# Patient Record
Sex: Female | Born: 1955 | Race: White | Hispanic: No | Marital: Single | Smoking: Former smoker
Health system: Southern US, Community
[De-identification: ages and names within clinical notes are randomized; demographics above are authoritative.]

## PROBLEM LIST (undated history)

## (undated) DIAGNOSIS — C50919 Malignant neoplasm of unspecified site of unspecified female breast: Secondary | ICD-10-CM

## (undated) DIAGNOSIS — Z8719 Personal history of other diseases of the digestive system: Secondary | ICD-10-CM

## (undated) DIAGNOSIS — K589 Irritable bowel syndrome without diarrhea: Secondary | ICD-10-CM

## (undated) DIAGNOSIS — K219 Gastro-esophageal reflux disease without esophagitis: Secondary | ICD-10-CM

## (undated) DIAGNOSIS — M1990 Unspecified osteoarthritis, unspecified site: Secondary | ICD-10-CM

## (undated) DIAGNOSIS — R232 Flushing: Secondary | ICD-10-CM

## (undated) HISTORY — PX: COLONOSCOPY W/ POLYPECTOMY: SHX1380

## (undated) HISTORY — PX: MASTECTOMY: SHX3

## (undated) HISTORY — PX: FINGER FRACTURE SURGERY: SHX638

---

## 2013-06-09 ENCOUNTER — Ambulatory Visit: Payer: Self-pay | Admitting: Physician Assistant

## 2013-07-10 ENCOUNTER — Ambulatory Visit: Payer: Self-pay

## 2013-08-07 ENCOUNTER — Ambulatory Visit: Payer: Self-pay | Admitting: Emergency Medicine

## 2018-01-06 ENCOUNTER — Encounter
Admission: EM | Disposition: A | Discharge status: Home Health | Payer: Self-pay | Source: Home / Self Care | Attending: Specialist

## 2018-01-06 ENCOUNTER — Inpatient Hospital Stay
Admission: EM | Admit: 2018-01-06 | Discharge: 2018-01-08 | DRG: 494 | Disposition: A | Discharge status: Home Health | Payer: BLUE CROSS/BLUE SHIELD | Attending: Specialist | Admitting: Specialist

## 2018-01-06 ENCOUNTER — Emergency Department: Payer: BLUE CROSS/BLUE SHIELD

## 2018-01-06 ENCOUNTER — Other Ambulatory Visit: Payer: Self-pay

## 2018-01-06 ENCOUNTER — Inpatient Hospital Stay: Payer: BLUE CROSS/BLUE SHIELD | Admitting: Anesthesiology

## 2018-01-06 ENCOUNTER — Encounter: Payer: Self-pay | Admitting: Emergency Medicine

## 2018-01-06 DIAGNOSIS — W010XXA Fall on same level from slipping, tripping and stumbling without subsequent striking against object, initial encounter: Secondary | ICD-10-CM | POA: Diagnosis present

## 2018-01-06 DIAGNOSIS — M25571 Pain in right ankle and joints of right foot: Secondary | ICD-10-CM | POA: Diagnosis not present

## 2018-01-06 DIAGNOSIS — J45909 Unspecified asthma, uncomplicated: Secondary | ICD-10-CM | POA: Diagnosis present

## 2018-01-06 DIAGNOSIS — Z7951 Long term (current) use of inhaled steroids: Secondary | ICD-10-CM

## 2018-01-06 DIAGNOSIS — S82891A Other fracture of right lower leg, initial encounter for closed fracture: Secondary | ICD-10-CM | POA: Diagnosis present

## 2018-01-06 DIAGNOSIS — Z01810 Encounter for preprocedural cardiovascular examination: Secondary | ICD-10-CM | POA: Diagnosis not present

## 2018-01-06 DIAGNOSIS — Y92008 Other place in unspecified non-institutional (private) residence as the place of occurrence of the external cause: Secondary | ICD-10-CM | POA: Diagnosis not present

## 2018-01-06 DIAGNOSIS — Z23 Encounter for immunization: Secondary | ICD-10-CM | POA: Diagnosis not present

## 2018-01-06 DIAGNOSIS — S82851A Displaced trimalleolar fracture of right lower leg, initial encounter for closed fracture: Secondary | ICD-10-CM

## 2018-01-06 DIAGNOSIS — T148XXA Other injury of unspecified body region, initial encounter: Secondary | ICD-10-CM

## 2018-01-06 DIAGNOSIS — S9304XA Dislocation of right ankle joint, initial encounter: Secondary | ICD-10-CM

## 2018-01-06 HISTORY — DX: Malignant neoplasm of unspecified site of unspecified female breast: C50.919

## 2018-01-06 HISTORY — DX: Flushing: R23.2

## 2018-01-06 HISTORY — PX: ORIF ANKLE FRACTURE: SHX5408

## 2018-01-06 LAB — CBC WITH DIFFERENTIAL/PLATELET
BASOS ABS: 0 10*3/uL (ref 0–0.1)
Basophils Relative: 1 %
EOS ABS: 0.2 10*3/uL (ref 0–0.7)
Eosinophils Relative: 2 %
HCT: 38.7 % (ref 35.0–47.0)
Hemoglobin: 13.1 g/dL (ref 12.0–16.0)
Lymphocytes Relative: 25 %
Lymphs Abs: 2 10*3/uL (ref 1.0–3.6)
MCH: 29.9 pg (ref 26.0–34.0)
MCHC: 34 g/dL (ref 32.0–36.0)
MCV: 88 fL (ref 80.0–100.0)
Monocytes Absolute: 0.5 10*3/uL (ref 0.2–0.9)
Monocytes Relative: 6 %
Neutro Abs: 5.1 10*3/uL (ref 1.4–6.5)
Neutrophils Relative %: 66 %
Platelets: 224 10*3/uL (ref 150–440)
RBC: 4.39 MIL/uL (ref 3.80–5.20)
RDW: 14.2 % (ref 11.5–14.5)
WBC: 7.8 10*3/uL (ref 3.6–11.0)

## 2018-01-06 LAB — COMPREHENSIVE METABOLIC PANEL
ALK PHOS: 43 U/L (ref 38–126)
ALT: 17 U/L (ref 0–44)
ANION GAP: 9 (ref 5–15)
AST: 19 U/L (ref 15–41)
Albumin: 3.9 g/dL (ref 3.5–5.0)
BUN: 9 mg/dL (ref 8–23)
CALCIUM: 8.8 mg/dL — AB (ref 8.9–10.3)
CO2: 23 mmol/L (ref 22–32)
CREATININE: 0.54 mg/dL (ref 0.44–1.00)
Chloride: 109 mmol/L (ref 98–111)
GFR calc Af Amer: >60 mL/min (ref >60)
GFR calc non Af Amer: >60 mL/min (ref >60)
GLUCOSE: 92 mg/dL (ref 70–99)
Potassium: 4.2 mmol/L (ref 3.5–5.1)
Sodium: 141 mmol/L (ref 135–145)
Total Bilirubin: 0.3 mg/dL (ref 0.3–1.2)
Total Protein: 7 g/dL (ref 6.5–8.1)

## 2018-01-06 LAB — ETHANOL: Alcohol, Ethyl (B): 141 mg/dL — ABNORMAL HIGH (ref <10)

## 2018-01-06 LAB — PROTIME-INR
INR: 0.92
PROTHROMBIN TIME: 12.3 s (ref 11.4–15.2)

## 2018-01-06 LAB — SURGICAL PCR SCREEN
MRSA, PCR: NEGATIVE
Staphylococcus aureus: NEGATIVE

## 2018-01-06 SURGERY — OPEN REDUCTION INTERNAL FIXATION (ORIF) ANKLE FRACTURE
Anesthesia: Spinal | Laterality: Right

## 2018-01-06 MED ORDER — MIDAZOLAM HCL 2 MG/2ML IJ SOLN
INTRAMUSCULAR | Status: AC
  Filled 2018-01-06: qty 2

## 2018-01-06 MED ORDER — FENTANYL CITRATE (PF) 100 MCG/2ML IJ SOLN
INTRAMUSCULAR | Status: DC | PRN
  Administered 2018-01-06: 100 ug via INTRAVENOUS

## 2018-01-06 MED ORDER — CEFAZOLIN SODIUM-DEXTROSE 1-4 GM/50ML-% IV SOLN
1.0000 g | INTRAVENOUS | Status: AC
  Administered 2018-01-06: 1 g via INTRAVENOUS
  Filled 2018-01-06: qty 50

## 2018-01-06 MED ORDER — BUPIVACAINE HCL (PF) 0.5 % IJ SOLN
INTRAMUSCULAR | Status: AC
  Filled 2018-01-06: qty 30

## 2018-01-06 MED ORDER — PROPOFOL 10 MG/ML IV BOLUS
INTRAVENOUS | Status: DC | PRN
  Administered 2018-01-06: 30 mg via INTRAVENOUS
  Administered 2018-01-06: 40 mg via INTRAVENOUS
  Administered 2018-01-06: 30 mg via INTRAVENOUS

## 2018-01-06 MED ORDER — CEFAZOLIN SODIUM-DEXTROSE 2-4 GM/100ML-% IV SOLN
2.0000 g | Freq: Three times a day (TID) | INTRAVENOUS | Status: AC
  Administered 2018-01-06 – 2018-01-07 (×3): 2 g via INTRAVENOUS
  Filled 2018-01-06 (×4): qty 100

## 2018-01-06 MED ORDER — ONDANSETRON HCL 4 MG/2ML IJ SOLN
INTRAMUSCULAR | Status: AC
  Filled 2018-01-06: qty 2

## 2018-01-06 MED ORDER — SODIUM CHLORIDE 0.9 % IV SOLN
INTRAVENOUS | Status: DC | PRN
  Administered 2018-01-06: 50 ug/min via INTRAVENOUS

## 2018-01-06 MED ORDER — ONDANSETRON HCL 4 MG/2ML IJ SOLN
4.0000 mg | Freq: Once | INTRAMUSCULAR | Status: AC
Start: 2018-01-06 — End: 2018-01-06
  Administered 2018-01-06: 4 mg via INTRAVENOUS
  Filled 2018-01-06: qty 2

## 2018-01-06 MED ORDER — PROPOFOL 10 MG/ML IV BOLUS
INTRAVENOUS | Status: AC
  Filled 2018-01-06: qty 20

## 2018-01-06 MED ORDER — EPHEDRINE SULFATE 50 MG/ML IJ SOLN
INTRAMUSCULAR | Status: DC | PRN
  Administered 2018-01-06: 50 mg via INTRAMUSCULAR

## 2018-01-06 MED ORDER — DEXMEDETOMIDINE HCL 200 MCG/2ML IV SOLN
INTRAVENOUS | Status: DC | PRN
  Administered 2018-01-06 (×2): 8 ug via INTRAVENOUS
  Administered 2018-01-06: 12 ug via INTRAVENOUS

## 2018-01-06 MED ORDER — SODIUM CHLORIDE 0.9 % IV SOLN
INTRAVENOUS | Status: DC
  Administered 2018-01-06 (×2): via INTRAVENOUS
  Administered 2018-01-06: 100 mL/h via INTRAVENOUS

## 2018-01-06 MED ORDER — TETANUS-DIPHTH-ACELL PERTUSSIS 5-2.5-18.5 LF-MCG/0.5 IM SUSP
0.5000 mL | Freq: Once | INTRAMUSCULAR | Status: AC
  Administered 2018-01-06: 0.5 mL via INTRAMUSCULAR
  Filled 2018-01-06: qty 0.5

## 2018-01-06 MED ORDER — PHENYLEPHRINE HCL 10 MG/ML IJ SOLN
INTRAMUSCULAR | Status: AC
  Filled 2018-01-06: qty 1

## 2018-01-06 MED ORDER — CLINDAMYCIN PHOSPHATE 600 MG/50ML IV SOLN
600.0000 mg | Freq: Three times a day (TID) | INTRAVENOUS | Status: AC
  Administered 2018-01-06 – 2018-01-07 (×3): 600 mg via INTRAVENOUS
  Filled 2018-01-06 (×3): qty 50

## 2018-01-06 MED ORDER — FENTANYL CITRATE (PF) 100 MCG/2ML IJ SOLN
INTRAMUSCULAR | Status: AC
  Filled 2018-01-06: qty 2

## 2018-01-06 MED ORDER — CLINDAMYCIN PHOSPHATE 600 MG/50ML IV SOLN
600.0000 mg | INTRAVENOUS | Status: DC
  Filled 2018-01-06: qty 50

## 2018-01-06 MED ORDER — FENTANYL CITRATE (PF) 100 MCG/2ML IJ SOLN
50.0000 ug | Freq: Once | INTRAMUSCULAR | Status: AC
  Administered 2018-01-06: 50 ug via INTRAVENOUS
  Filled 2018-01-06: qty 2

## 2018-01-06 MED ORDER — ETOMIDATE 2 MG/ML IV SOLN
INTRAVENOUS | Status: AC
  Filled 2018-01-06: qty 10

## 2018-01-06 MED ORDER — MELOXICAM 7.5 MG PO TABS
15.0000 mg | ORAL_TABLET | Freq: Every day | ORAL | Status: DC
  Administered 2018-01-06 – 2018-01-08 (×3): 15 mg via ORAL
  Filled 2018-01-06 (×4): qty 2

## 2018-01-06 MED ORDER — MIDAZOLAM HCL 2 MG/2ML IJ SOLN
INTRAMUSCULAR | Status: DC | PRN
  Administered 2018-01-06: 2 mg via INTRAVENOUS

## 2018-01-06 MED ORDER — BUPIVACAINE HCL 0.5 % IJ SOLN
INTRAMUSCULAR | Status: DC | PRN
Start: 2018-01-06 — End: 2018-01-06
  Administered 2018-01-06: 30 mL

## 2018-01-06 MED ORDER — BUPIVACAINE HCL (PF) 0.5 % IJ SOLN
INTRAMUSCULAR | Status: AC
  Filled 2018-01-06: qty 10

## 2018-01-06 MED ORDER — LORATADINE 10 MG PO TABS
10.0000 mg | ORAL_TABLET | Freq: Every day | ORAL | Status: DC
  Administered 2018-01-06 – 2018-01-08 (×3): 10 mg via ORAL
  Filled 2018-01-06 (×3): qty 1

## 2018-01-06 MED ORDER — SODIUM CHLORIDE 0.9 % IV BOLUS
1000.0000 mL | Freq: Once | INTRAVENOUS | Status: AC
  Administered 2018-01-06: 1000 mL via INTRAVENOUS

## 2018-01-06 MED ORDER — FENTANYL CITRATE (PF) 100 MCG/2ML IJ SOLN: 25.0000 ug | INTRAMUSCULAR | Status: DC | PRN

## 2018-01-06 MED ORDER — PROPOFOL 500 MG/50ML IV EMUL
INTRAVENOUS | Status: AC
  Filled 2018-01-06: qty 50

## 2018-01-06 MED ORDER — ONDANSETRON HCL 4 MG/2ML IJ SOLN
INTRAMUSCULAR | Status: DC | PRN
  Administered 2018-01-06: 4 mg via INTRAVENOUS

## 2018-01-06 MED ORDER — MONTELUKAST SODIUM 10 MG PO TABS
10.0000 mg | ORAL_TABLET | Freq: Every day | ORAL | Status: DC
  Administered 2018-01-06 – 2018-01-07 (×2): 10 mg via ORAL
  Filled 2018-01-06 (×2): qty 1

## 2018-01-06 MED ORDER — ONDANSETRON HCL 4 MG/2ML IJ SOLN: 4.0000 mg | Freq: Once | INTRAMUSCULAR | Status: DC | PRN

## 2018-01-06 MED ORDER — FLUTICASONE PROPIONATE 50 MCG/ACT NA SUSP
1.0000 | Freq: Two times a day (BID) | NASAL | Status: DC
  Administered 2018-01-06 – 2018-01-08 (×5): 1 via NASAL
  Filled 2018-01-06: qty 16

## 2018-01-06 MED ORDER — ETOMIDATE 2 MG/ML IV SOLN
10.0000 mg | Freq: Once | INTRAVENOUS | Status: AC
  Administered 2018-01-06: 6 mg via INTRAVENOUS

## 2018-01-06 MED ORDER — CLINDAMYCIN PHOSPHATE 600 MG/50ML IV SOLN
600.0000 mg | INTRAVENOUS | Status: AC
  Administered 2018-01-06: 600 mg via INTRAVENOUS
  Filled 2018-01-06: qty 50

## 2018-01-06 MED ORDER — GABAPENTIN 300 MG PO CAPS
300.0000 mg | ORAL_CAPSULE | Freq: Three times a day (TID) | ORAL | Status: DC
  Administered 2018-01-06 – 2018-01-08 (×5): 300 mg via ORAL
  Filled 2018-01-06 (×5): qty 1

## 2018-01-06 MED ORDER — PHENYLEPHRINE HCL 10 MG/ML IJ SOLN
INTRAMUSCULAR | Status: DC | PRN
  Administered 2018-01-06 (×2): 200 ug via INTRAVENOUS

## 2018-01-06 MED ORDER — FENTANYL CITRATE (PF) 100 MCG/2ML IJ SOLN
50.0000 ug | Freq: Once | INTRAMUSCULAR | Status: AC
  Administered 2018-01-06: 50 ug via INTRAVENOUS

## 2018-01-06 MED ORDER — EPHEDRINE SULFATE 50 MG/ML IJ SOLN
INTRAMUSCULAR | Status: AC
  Filled 2018-01-06: qty 1

## 2018-01-06 MED ORDER — NEOMYCIN-POLYMYXIN B GU 40-200000 IR SOLN
Status: AC
  Filled 2018-01-06: qty 4

## 2018-01-06 MED ORDER — CEFAZOLIN SODIUM-DEXTROSE 1-4 GM/50ML-% IV SOLN
1.0000 g | Freq: Once | INTRAVENOUS | Status: AC
  Administered 2018-01-06: 1 g via INTRAVENOUS
  Filled 2018-01-06: qty 50

## 2018-01-06 MED ORDER — NEOMYCIN-POLYMYXIN B GU 40-200000 IR SOLN
Status: DC | PRN
  Administered 2018-01-06: 4 mL

## 2018-01-06 MED ORDER — MORPHINE SULFATE (PF) 2 MG/ML IV SOLN: 1.0000 mg | INTRAVENOUS | Status: DC | PRN

## 2018-01-06 MED ORDER — DEXMEDETOMIDINE HCL IN NACL 200 MCG/50ML IV SOLN
INTRAVENOUS | Status: AC
  Filled 2018-01-06: qty 50

## 2018-01-06 MED ORDER — MUPIROCIN 2 % EX OINT
1.0000 "application " | TOPICAL_OINTMENT | Freq: Two times a day (BID) | CUTANEOUS | Status: DC
  Filled 2018-01-06: qty 22

## 2018-01-06 MED ORDER — GABAPENTIN 300 MG PO CAPS
300.0000 mg | ORAL_CAPSULE | Freq: Two times a day (BID) | ORAL | Status: DC
  Administered 2018-01-06: 300 mg via ORAL
  Filled 2018-01-06: qty 1

## 2018-01-06 MED ORDER — CEFAZOLIN SODIUM-DEXTROSE 2-4 GM/100ML-% IV SOLN
2.0000 g | INTRAVENOUS | Status: DC
  Filled 2018-01-06: qty 100

## 2018-01-06 MED ORDER — PROPOFOL 500 MG/50ML IV EMUL
INTRAVENOUS | Status: DC | PRN
  Administered 2018-01-06: 100 ug/kg/min via INTRAVENOUS
  Administered 2018-01-06: 11:00:00 via INTRAVENOUS

## 2018-01-06 MED ORDER — BUPIVACAINE HCL (PF) 0.5 % IJ SOLN
INTRAMUSCULAR | Status: DC | PRN
  Administered 2018-01-06: 3 mL via INTRATHECAL

## 2018-01-06 MED ORDER — HYDROCODONE-ACETAMINOPHEN 7.5-325 MG PO TABS
1.0000 | ORAL_TABLET | Freq: Four times a day (QID) | ORAL | Status: DC | PRN
  Administered 2018-01-06 – 2018-01-08 (×8): 1 via ORAL
  Filled 2018-01-06 (×8): qty 1

## 2018-01-06 SURGICAL SUPPLY — 56 items
BIT DRILL 2.5X110 QC LCP DISP (BIT) ×2 IMPLANT
BIT DRILL CANN 2.7X625 NONSTRL (BIT) ×2 IMPLANT
BLADE SURG SZ10 CARB STEEL (BLADE) ×2 IMPLANT
BNDG COHESIVE 4X5 TAN STRL (GAUZE/BANDAGES/DRESSINGS) ×2 IMPLANT
BNDG ESMARK 6X12 TAN STRL LF (GAUZE/BANDAGES/DRESSINGS) ×2 IMPLANT
CANISTER SUCT 1200ML W/VALVE (MISCELLANEOUS) ×2 IMPLANT
CAST PADDING 6X4YD ST 30248 (SOFTGOODS) ×1
CHLORAPREP W/TINT 26ML (MISCELLANEOUS) ×2 IMPLANT
CUFF TOURN 24 STER (MISCELLANEOUS) IMPLANT
CUFF TOURN 30 STER DUAL PORT (MISCELLANEOUS) IMPLANT
DRAPE FLUOR MINI C-ARM 54X84 (DRAPES) ×2 IMPLANT
DRSG TEGADERM 4X4.75 (GAUZE/BANDAGES/DRESSINGS) ×4 IMPLANT
ELECT REM PT RETURN 9FT ADLT (ELECTROSURGICAL) ×2
ELECTRODE REM PT RTRN 9FT ADLT (ELECTROSURGICAL) ×1 IMPLANT
GAUZE PETRO XEROFOAM 1X8 (MISCELLANEOUS) ×2 IMPLANT
GAUZE SPONGE 4X4 12PLY STRL (GAUZE/BANDAGES/DRESSINGS) ×2 IMPLANT
GAUZE XEROFORM 4X4 STRL (GAUZE/BANDAGES/DRESSINGS) ×2 IMPLANT
GLOVE SURG ORTHO 8.0 STRL STRW (GLOVE) ×6 IMPLANT
GOWN STRL REUS W/ TWL LRG LVL3 (GOWN DISPOSABLE) ×1 IMPLANT
GOWN STRL REUS W/TWL LRG LVL3 (GOWN DISPOSABLE) ×1
GOWN STRL REUS W/TWL LRG LVL4 (GOWN DISPOSABLE) ×2 IMPLANT
GUIDEWARE NON THREAD 1.25X150 (WIRE) ×8
GUIDEWIRE NON THREAD 1.25X150 (WIRE) ×4 IMPLANT
HANDLE YANKAUER SUCT BULB TIP (MISCELLANEOUS) ×2 IMPLANT
KIT TURNOVER KIT A (KITS) ×2 IMPLANT
LABEL OR SOLS (LABEL) ×2 IMPLANT
NS IRRIG 1000ML POUR BTL (IV SOLUTION) ×2 IMPLANT
PACK EXTREMITY ARMC (MISCELLANEOUS) ×2 IMPLANT
PAD CAST CTTN 4X4 STRL (SOFTGOODS) ×1 IMPLANT
PAD PREP 24X41 OB/GYN DISP (PERSONAL CARE ITEMS) ×2 IMPLANT
PADDING CAST COTTON 4X4 STRL (SOFTGOODS) ×1
PADDING CAST COTTON 6X4 ST (SOFTGOODS) ×1 IMPLANT
PLATE LCP 3.5 1/3 TUB 8HX93 (Plate) ×2 IMPLANT
SCREW CANC FT 4.0X20 (Screw) ×2 IMPLANT
SCREW CANC FT/18 4.0 (Screw) ×2 IMPLANT
SCREW CANN L THRD/34 4.0 (Screw) ×2 IMPLANT
SCREW CANN L THRD/38 4.0 (Screw) ×2 IMPLANT
SCREW CANN L THRD/48 4.0 (Screw) ×2 IMPLANT
SCREW CORTEX 3.5 12MM (Screw) ×3 IMPLANT
SCREW CORTEX 3.5 14MM (Screw) ×2 IMPLANT
SCREW CORTEX 3.5 16MM (Screw) ×1 IMPLANT
SCREW CORTEX 3.5 18MM (Screw) ×1 IMPLANT
SCREW LOCK CORT ST 3.5X12 (Screw) ×3 IMPLANT
SCREW LOCK CORT ST 3.5X14 (Screw) ×2 IMPLANT
SCREW LOCK CORT ST 3.5X16 (Screw) ×1 IMPLANT
SCREW LOCK CORT ST 3.5X18 (Screw) ×1 IMPLANT
SPLINT CAST 1 STEP 5X30 WHT (MISCELLANEOUS) ×2 IMPLANT
SPONGE LAP 18X18 RF (DISPOSABLE) ×2 IMPLANT
STAPLER SKIN PROX 35W (STAPLE) ×2 IMPLANT
STOCKINETTE BIAS CUT 6 980064 (GAUZE/BANDAGES/DRESSINGS) ×2 IMPLANT
STOCKINETTE STRL 6IN 960660 (GAUZE/BANDAGES/DRESSINGS) ×2 IMPLANT
SUT VIC AB 2-0 CT1 27 (SUTURE) ×1
SUT VIC AB 2-0 CT1 TAPERPNT 27 (SUTURE) ×1 IMPLANT
SUT VIC AB 3-0 SH 27 (SUTURE) ×1
SUT VIC AB 3-0 SH 27X BRD (SUTURE) ×1 IMPLANT
WASHER 7MM DIA (Washer) ×2 IMPLANT

## 2018-01-06 NOTE — ED Notes (Signed)
Dr Beather Arbour in to follow up; pt to be admitted for surgery, probably today; pt understands nothing to eat or drink;

## 2018-01-06 NOTE — ED Notes (Addendum)
Dr Berdine Dance, RN and ED techs Yetta Flock and Alma Friendly at bedside; time out called

## 2018-01-06 NOTE — ED Notes (Signed)
Pt able to answer questions and follow commands; movement of toes without difficulty

## 2018-01-06 NOTE — Anesthesia Preprocedure Evaluation (Signed)
Anesthesia Evaluation  Patient identified by MRN, date of birth, ID band Patient awake    Reviewed: Allergy & Precautions, NPO status , Patient's Chart, lab work & pertinent test results  History of Anesthesia Complications Negative for: history of anesthetic complications  Airway Mallampati: II       Dental  (+) Partial Upper   Pulmonary asthma , neg sleep apnea, neg COPD,           Cardiovascular (-) hypertension(-) Past MI and (-) CHF (-) dysrhythmias (-) Valvular Problems/Murmurs     Neuro/Psych neg Seizures    GI/Hepatic Neg liver ROS, neg GERD  ,  Endo/Other  neg diabetes  Renal/GU negative Renal ROS     Musculoskeletal   Abdominal   Peds  Hematology   Anesthesia Other Findings   Reproductive/Obstetrics                             Anesthesia Physical Anesthesia Plan  ASA: II and emergent  Anesthesia Plan: Spinal   Post-op Pain Management:    Induction:   PONV Risk Score and Plan:   Airway Management Planned:   Additional Equipment:   Intra-op Plan:   Post-operative Plan:   Informed Consent: I have reviewed the patients History and Physical, chart, labs and discussed the procedure including the risks, benefits and alternatives for the proposed anesthesia with the patient or authorized representative who has indicated his/her understanding and acceptance.     Plan Discussed with:   Anesthesia Plan Comments:         Anesthesia Quick Evaluation

## 2018-01-06 NOTE — ED Notes (Signed)
Xray complete

## 2018-01-06 NOTE — Progress Notes (Signed)
Patient wide awake on pacu arrival.  Top denture returned to patient as earlier promised.  Patient Very appreciative.

## 2018-01-06 NOTE — Transfer of Care (Signed)
Immediate Anesthesia Transfer of Care Note  Patient: Melissa Floyd  Procedure(s) Performed: OPEN REDUCTION INTERNAL FIXATION (ORIF) ANKLE FRACTURE (Right )  Patient Location: PACU  Anesthesia Type:Spinal  Level of Consciousness: awake, alert  and oriented  Airway & Oxygen Therapy: Patient Spontanous Breathing  Post-op Assessment: Report given to RN and Post -op Vital signs reviewed and stable  Post vital signs: Reviewed and stable  Last Vitals:  Vitals Value Taken Time  BP 115/68 01/06/2018  1:21 PM  Temp    Pulse 82 01/06/2018  1:22 PM  Resp 13 01/06/2018  1:22 PM  SpO2 100 % 01/06/2018  1:22 PM  Vitals shown include unvalidated device data.  Last Pain:  Vitals:   01/06/18 0845  TempSrc: Oral  PainSc:          Complications: No apparent anesthesia complications

## 2018-01-06 NOTE — ED Notes (Signed)
Stopped triage to assist pt to toilet in room to void; back to bed without incident; xray tech at bedside for portable films

## 2018-01-06 NOTE — ED Provider Notes (Signed)
[facility] Emergency Department Provider Note   ____________________________________________   First MD Initiated Contact with Patient 01/06/18 [phone]     (approximate)  I have reviewed the triage vital signs and the nursing notes.   HISTORY  Chief Complaint Ankle Pain    HPI Melissa Floyd is a 62 y.o. female brought to the ED from home via EMS with a chief complaint of right ankle injury and pain.  Patient states she was in her carport and tripped, twisting her ankle.  Denies striking head or LOC.  Does admit to 4 glasses of wine this evening, last prior to arrival.  Denies use of anticoagulants.  Denies headache, vision changes, neck pain, chest pain, shortness of breath, abdominal pain, nausea or vomiting.   Past medical history None  Patient Active Problem List   Diagnosis Date Noted  . Closed right ankle fracture, initial encounter 01/06/2018     Prior to Admission medications   Medication Sig Start Date End Date Taking? Authorizing Provider  cetirizine (ZYRTEC) 10 MG tablet Take 10 mg by mouth daily.   Yes [provider]  fluticasone (FLONASE) 50 MCG/ACT nasal spray Place 1 spray into both nostrils 2 (two) times daily.   Yes [provider]  gabapentin (NEURONTIN) 300 MG capsule Take 300 mg by mouth 2 (two) times daily.   Yes [provider]  montelukast (SINGULAIR) 10 MG tablet Take 10 mg by mouth at bedtime.   Yes [provider]    Allergies Penicillins  History reviewed. No pertinent family history.  Social History Social History   Tobacco Use  . Smoking status: Never Smoker  . Smokeless tobacco: Never Used  Substance Use Topics  . Alcohol use: Yes  . Drug use: Never    Review of Systems  Constitutional: No fever/chills Eyes: No visual changes. ENT: No sore throat. Cardiovascular: Denies chest pain. Respiratory: Denies shortness of breath. Gastrointestinal: No abdominal pain.  No  nausea, no vomiting.  No diarrhea.  No constipation. Genitourinary: Negative for dysuria. Musculoskeletal: Positive for right ankle pain and injury.  Negative for back pain. Skin: Negative for rash. Neurological: Negative for headaches, focal weakness or numbness.   ____________________________________________   PHYSICAL EXAM:  VITAL SIGNS: ED Triage Vitals  Enc Vitals Group     BP 01/06/18 0317 123/79     Pulse Rate 01/06/18 0317 86     Resp 01/06/18 0317 17     Temp 01/06/18 0317 97.7 F (36.5 C)     Temp Source 01/06/18 0317 Oral     SpO2 01/06/18 0317 100 %     Weight 01/06/18 0320 160 lb (72.6 kg)     Height 01/06/18 0320 5\' 8"  (1.727 m)     Head Circumference --      Peak Flow --      Pain Score 01/06/18 0320 6     Pain Loc --      Pain Edu? --      Excl. in [place]? --     Constitutional: Alert and oriented. Well appearing and in mild acute distress. Eyes: Conjunctivae are normal. PERRL. EOMI. Head: Atraumatic. Nose: No congestion/rhinnorhea. Mouth/Throat: Mucous membranes are moist.  Oropharynx non-erythematous. Neck: No stridor.  No cervical spine tenderness to palpation. Cardiovascular: Normal rate, regular rhythm. Grossly normal heart sounds.  Good peripheral circulation. Respiratory: Normal respiratory effort.  No retractions. Lungs CTAB. Gastrointestinal: Soft and nontender. No distention. No abdominal bruits. No CVA tenderness. Musculoskeletal:  Right ankle  with obvious deformity.  Palpable pulses.  Able to wiggle toes.  Minor abrasions to lower leg and foot. Neurologic:  Normal speech and language. No gross focal neurologic deficits are appreciated.  Skin:  Skin is warm, dry and intact. No rash noted. Psychiatric: Mood and affect are normal. Speech and behavior are normal.  ____________________________________________   LABS (all labs ordered are listed, but only abnormal results are displayed)  Labs Reviewed  COMPREHENSIVE METABOLIC PANEL - Abnormal;  Notable for the following components:      Result Value   Calcium 8.8 (*)    All other components within normal limits  ETHANOL - Abnormal; Notable for the following components:   Alcohol, Ethyl (B) 141 (*)    All other components within normal limits  CBC WITH DIFFERENTIAL/PLATELET   ____________________________________________  EKG  None ____________________________________________  RADIOLOGY  ED MD interpretation: Trimalleolar fracture and dislocation  Official radiology report(s): Dg Ankle 2 Views Right  Result Date: 01/06/2018 CLINICAL DATA:  Ankle fracture, postreduction. EXAM: RIGHT ANKLE - 2 VIEW COMPARISON:  Pre reduction radiographs earlier this day. FINDINGS: Overlying splint material is been placed which limits osseous and soft tissue fine detail. Improved alignment of comminuted trimalleolar fracture postreduction. Decreased angulation and displacement of distal fibular shaft fracture. Decreased displacement of medial malleolar fracture. Decreased displacement posterior tibial tubercle fracture. Improved tibial talar alignment meth residual apex medial talar tilt. IMPRESSION: Improved alignment of trimalleolar fracture postreduction. Mild persistent talar tilt and fracture displacement. Electronically Signed   By: Jeb Levering M.D.   On: 01/06/2018 06:22   Dg Ankle Complete Right  Result Date: 01/06/2018 CLINICAL DATA:  Right ankle pain and deformity after injury. Fall at home. EXAM: RIGHT ANKLE - COMPLETE 3+ VIEW COMPARISON:  None. FINDINGS: Ankle fracture dislocation. Displaced angulated distal fibular fracture with apex medial angulation. Distal tibial fracture is intra-articular involving the medial and likely posterior malleolus, osseous overlap limits detailed assessment. There is lateral and posterior subluxation of the talus with respect to the tibial plafond and. Diffuse soft tissue edema. Plantar calcaneal spur. IMPRESSION: Displaced and angulated trimalleolar  fracture with posterolateral subluxation of the talus with respect to the tibial plafond and. Electronically Signed   By: Jeb Levering M.D.   On: 01/06/2018 03:48    ____________________________________________   PROCEDURES  Procedure(s) performed:     .Sedation Date/Time: 01/06/2018 5:51 AM Performed by: Paulette Blanch, MD Authorized by: Paulette Blanch, MD   Consent:    Consent obtained:  Written (electronic informed consent)   Consent given by:  Patient   Risks discussed:  Allergic reaction, dysrhythmia, inadequate sedation, nausea, vomiting, respiratory compromise necessitating ventilatory assistance and intubation, prolonged sedation necessitating reversal and prolonged hypoxia resulting in organ damage Universal protocol:    Procedure explained and questions answered to patient or proxy's satisfaction: yes     Relevant documents present and verified: yes     Test results available and properly labeled: yes     Imaging studies available: yes     Required blood products, implants, devices, and special equipment available: yes     Immediately prior to procedure a time out was called: yes     Patient identity confirmation method:  Arm band and verbally with patient Indications:    Procedure performed:  Dislocation reduction   Procedure necessitating sedation performed by:  Physician performing sedation   Intended level of sedation:  Moderate (conscious sedation) Pre-sedation assessment:    Time since last food or drink:  3am  ASA classification: class 1 - normal, healthy patient     Neck mobility: normal     Mouth opening:  3 or more finger widths   Thyromental distance:  4 finger widths   Mallampati score:  I - soft palate, uvula, fauces, pillars visible   Pre-sedation assessments completed and reviewed: airway patency, cardiovascular function, hydration status, mental status, nausea/vomiting, pain level, respiratory function and temperature   Immediate pre-procedure  details:    Reassessment: Patient reassessed immediately prior to procedure     Reviewed: vital signs, relevant labs/tests and NPO status     Verified: bag valve mask available, emergency equipment available, intubation equipment available, IV patency confirmed, oxygen available, reversal medications available and suction available   Procedure details (see MAR for exact dosages):    Preoxygenation:  Nasal cannula   Sedation:  Etomidate   Analgesia:  Fentanyl   Intra-procedure monitoring:  Blood pressure monitoring, continuous pulse oximetry, cardiac monitor, frequent vital sign checks and frequent LOC assessments   Intra-procedure events: none     Total Provider sedation time (minutes):  3 Post-procedure details:    Post-sedation assessment completed:  01/06/2018 5:54 AM   Attendance: Constant attendance by certified staff until patient recovered     Recovery: Patient returned to pre-procedure baseline     Post-sedation assessments completed and reviewed: airway patency, cardiovascular function, hydration status, mental status and respiratory function     Patient is stable for discharge or admission: yes     Patient tolerance:  Tolerated well, no immediate complications .Splint Application Date/Time: 07/12/8339 5:54 AM Performed by: Paulette Blanch, MD Authorized by: Paulette Blanch, MD   Consent:    Consent obtained:  Verbal   Consent given by:  Patient   Risks discussed:  Discoloration, numbness, pain and swelling Pre-procedure details:    Sensation:  Normal   Skin color:  Pink Procedure details:    Laterality:  Right   Location:  Ankle   Ankle:  R ankle   Splint type:  Ankle stirrup (Posterior)   Supplies:  Ortho-Glass, elastic bandage and cotton padding Post-procedure details:    Pain:  Improved   Sensation:  Normal   Skin color:  Pink; wiggling toes freely   Patient tolerance of procedure:  Tolerated well, no immediate complications    Critical Care performed: Yes, see critical  care note(s)   CRITICAL CARE Performed by: Paulette Blanch   Total critical care time: 45 minutes  Critical care time was exclusive of separately billable procedures and treating other patients.  Critical care was necessary to treat or prevent imminent or life-threatening deterioration.  Critical care was time spent personally by me on the following activities: development of treatment plan with patient and/or surrogate as well as nursing, discussions with consultants, evaluation of patient's response to treatment, examination of patient, obtaining history from patient or surrogate, ordering and performing treatments and interventions, ordering and review of laboratory studies, ordering and review of radiographic studies, pulse oximetry and re-evaluation of patient's condition.  ____________________________________________   INITIAL IMPRESSION / ASSESSMENT AND PLAN / ED COURSE  As part of my medical decision making, I reviewed the following data within the [place] History obtained from family, Nursing notes reviewed and incorporated, Labs reviewed, Old chart reviewed, Radiograph reviewed  and Notes from prior ED visits   62 year old female who presents status post fall with obvious right ankle injury.  X-rays demonstrate trimalleolar fracture with dislocation.  Will prepare patient for moderate sedation  for ankle reduction.  Clinical Course as of Jan 06 637  Sun Jan 06, 2018  3545 Fracture reduction well under etomidate sedation.  Will obtain good reduction films.   [JS]  [id number] Spoke with Dr. Sabra Heck from orthopedics who will review patient's x-rays and give me a call back with recommendations.   [JS]  6256 Dr. Sabra Heck to admit patient to the hospital with plans for surgery later today.  Updated patient and her spouse who are agreeable to plan of care.   [JS]    Clinical Course User Index [JS] Paulette Blanch, MD      ____________________________________________   FINAL CLINICAL IMPRESSION(S) / ED DIAGNOSES  Final diagnoses:  Acute right ankle pain  Trimalleolar fracture of ankle, closed, right, initial encounter  Abrasion  Ankle dislocation, right, initial encounter     ED Discharge Orders    None       Note:  This document was prepared using Dragon voice recognition software and may include unintentional dictation errors.    Paulette Blanch, MD 01/06/18 [phone]

## 2018-01-06 NOTE — H&P (Signed)
PREOPERATIVE H&P  Chief Complaint: Right Ankle Fracture  HPI: Melissa Floyd is a 62 y.o. female who presents for preoperative history and physical with a diagnosis of Right Ankle Fracture.  She suffered this trimalleolar fracture dislocation early this morning well walking from her car to her home.  She was brought to the emergency room where exam and x-rays revealed a's completely dislocated trimalleolar fracture dislocation of the right ankle.  This was reduced by Dr. Jerl Santos in the emergency room and splinted.  I was contacted and arranged for admission.  Discussed treatment with the patient and have recommended upper open reduction internal fixation since his extremely comminuted unstable fracture and treatment without surgery would undoubtedly lead to failure.  Risks and benefits of surgery discussed with her at length.  Wishes to proceed today.  Health is good.  Past Medical History:  Diagnosis Date  . Breast cancer ([place])   . Hot flashes    Past Surgical History:  Procedure Laterality Date  . MASTECTOMY Left    Social History   Socioeconomic History  . Marital status: Single    Spouse name: Not on file  . Number of children: Not on file  . Years of education: Not on file  . Highest education level: Not on file  Occupational History  . Not on file  Social Needs  . Financial resource strain: Not on file  . Food insecurity:    Worry: Not on file    Inability: Not on file  . Transportation needs:    Medical: Not on file    Non-medical: Not on file  Tobacco Use  . Smoking status: Never Smoker  . Smokeless tobacco: Never Used  Substance and Sexual Activity  . Alcohol use: Yes  . Drug use: Never  . Sexual activity: Not on file  Lifestyle  . Physical activity:    Days per week: Not on file    Minutes per session: Not on file  . Stress: Not on file  Relationships  . Social connections:    Talks on phone: Not on file    Gets together: Not on file    Attends religious  service: Not on file    Active member of club or organization: Not on file    Attends meetings of clubs or organizations: Not on file    Relationship status: Not on file  Other Topics Concern  . Not on file  Social History Narrative  . Not on file   History reviewed. No pertinent family history. Allergies  Allergen Reactions  . Penicillins Rash   Prior to Admission medications   Medication Sig Start Date End Date Taking? Authorizing Provider  cetirizine (ZYRTEC) 10 MG tablet Take 10 mg by mouth daily.   Yes [provider]  fluticasone (FLONASE) 50 MCG/ACT nasal spray Place 1 spray into both nostrils 2 (two) times daily.   Yes [provider]  gabapentin (NEURONTIN) 300 MG capsule Take 300 mg by mouth 2 (two) times daily.   Yes [provider]  montelukast (SINGULAIR) 10 MG tablet Take 10 mg by mouth at bedtime.   Yes [provider]     Positive ROS: All other systems have been reviewed and were otherwise negative with the exception of those mentioned in the HPI and as above.  Physical Exam: General: Alert, no acute distress Cardiovascular: No pedal edema. Heart is regular and without murmur.  Respiratory: No cyanosis, no use of accessory musculature. Lungs are clear. GI: No organomegaly,  abdomen is soft and non-tender Skin: No lesions in the area of chief complaint Neurologic: Sensation intact distally Psychiatric: Patient is competent for consent with normal mood and affect Lymphatic: No axillary or cervical lymphadenopathy  MUSCULOSKELETAL: Right foot and ankle are reduced in a posterior splint and Ace bandages.  Neurovascular status good distally.  In discussion with Dr. Jerl Santos the skin was intact other than mild scratches.  I did not take the dressings down at the bedside.  Assessment: Trimalleolar fracture dislocation right Ankle   Plan: Plan for Procedure(s): OPEN REDUCTION INTERNAL FIXATION (ORIF) ANKLE FRACTURE  The risks benefits  and alternatives were discussed with the patient including but not limited to the risks of nonoperative treatment, versus surgical intervention including infection, bleeding, nerve injury,  blood clots, cardiopulmonary complications, morbidity, mortality, among others, and they were willing to proceed.   Park Breed, MD [phone]   01/06/2018 9:37 AM

## 2018-01-06 NOTE — H&P (Signed)
THE PATIENT WAS SEEN PRIOR TO SURGERY TODAY.  HISTORY, ALLERGIES, HOME MEDICATIONS AND OPERATIVE PROCEDURE WERE REVIEWED. RISKS AND BENEFITS OF SURGERY DISCUSSED WITH PATIENT AGAIN.  NO CHANGES FROM INITIAL HISTORY AND PHYSICAL NOTED.    

## 2018-01-06 NOTE — Anesthesia Procedure Notes (Signed)
Spinal  Patient location during procedure: OR Start time: 01/06/2018 10:28 AM End time: 01/06/2018 10:31 AM Staffing Resident/CRNA: Clinton Sawyer, CRNA Performed: resident/CRNA  Preanesthetic Checklist Completed: patient identified, site marked, surgical consent, pre-op evaluation, timeout performed, IV checked, risks and benefits discussed and monitors and equipment checked Spinal Block Patient position: sitting Prep: ChloraPrep Patient monitoring: heart rate, continuous pulse ox and blood pressure Approach: midline Location: L4-5 Injection technique: single-shot Needle Needle type: Pencil-Tip  Needle gauge: 24 G Needle length: 9 cm Assessment Sensory level: T8 Additional Notes Patient sitting, L4-5 interspace, negative heme, negative paresthesia, clear CSF.

## 2018-01-06 NOTE — ED Notes (Signed)
Ankle relocated without difficulty by MD

## 2018-01-06 NOTE — ED Triage Notes (Addendum)
EMS pt from home following a fall; pt says she was going through a gate from her car to her house when she twisted her right ankle and fell; obvious deformity; positive sensation; positive movement; pulses palpable; unable to bear weight

## 2018-01-06 NOTE — ED Notes (Addendum)
Medication pushed by Catalina Antigua, RN

## 2018-01-06 NOTE — Progress Notes (Signed)
Chaplain received an OR to update or complete an AD. Chaplain provided education on the AD and informed patient that when/if ready to complete, I will return to assist. Chaplain also discussed the nature of patient's visit to the ED and her desire to be home soon. Patient's unexpected fall this morning is a reminder that although things are uncomfortable, "they can always be worse". Chaplain engaged in active listening and provided emotional support to the patient. Patient notes strong support in the form of her sister and fiance.    01/06/18 1535  Clinical Encounter Type  Visited With Patient  Visit Type Initial  Referral From Physician

## 2018-01-06 NOTE — ED Notes (Signed)
Xray at bedside for post procedure pictures

## 2018-01-06 NOTE — Progress Notes (Signed)
15 minute call to floor.  Ginger ale given to patient.

## 2018-01-06 NOTE — Op Note (Signed)
  01/06/2018  1:16 PM  PATIENT:  Melissa Floyd  62 y.o. female  PRE-OPERATIVE DIAGNOSIS:  Right Ankle trimalleolar fracture dislocation pOST-OPERATIVE DIAGNOSIS:  Same  PROCEDURE:  OPEN REDUCTION INTERNAL FIXATION (ORIF) ANKLE FRACTURE  SURGEON:  Nneoma Harral E Kristelle Cavallaro MD  ASST:    ANESTHESIA: Spinal EBL: Minimal  TOURNIQUET TIME: 119 minutes  OPERATIVE FINDINGS: Patient had an extremely comminuted extremely unstable trimalleolar fracture dislocation of the right ankle ankle with a large fragment off the posterior portion of the tibia.  There is comminution of the fibular shaft.  Some comminution along the medial malleolus.  OPERATIVE PROCEDURE:   The patient was brought to the operating room and placed in the supine position. All bony prominences were padded.  Spinal anesthesia was administered. The lower extremity was prepped and draped in the usual sterile fashion. The leg was elevated and exsanguinated and the tourniquet was inflated. Time out was performed.   Incision was made over the distal fibula and the fracture was exposed and reduced anatomically with a clamp. A lag screw was placed. I then applied a 8 hole one third semitubular plate and secured it proximally with cortical screws and distally with cancellus screws.  I used the Fluoroscan to confirm satisfactory reduction and fixation. This wound was irrigated and closed with vicryl sutures and staples.  I then turned my attention to the medial malleolus. Incision was made over the medial malleolus and the fracture exposed and held provisionally with a clamp. 2 guidepins were placed for  4.0 mm cannulated screws and then confirmation of reduction was made with fluoroscopy. I then placed one 4.0 mm screw and left one guidepin which was bent and cut.  Fluoroscopy showed good reduction and hardware placement.   2 anterior stab wounds were made anterolaterally over the distal tibia and dissection carried down to bone avoiding the  neurovascular structures. Two k-wires were passed anterior to posterior under fluoroscopy and then two 4.0 cannulated screws placed to secure the posterior malleolus in anatomic postion.  The syndesmosis was stressed using live fluoroscopy and found to be stable.   The medial and anterior wound was irrigated, and closed with vicryl and staples. Sponge and needle counts were correct. The wounds were injected with local anesthetic. Sterile gauze was applied followed by a posterior splint. She was awakened and returned to the PACU in stable and satisfactory condition. There were no complications.  Park Breed, MD

## 2018-01-06 NOTE — Progress Notes (Signed)
Chaplain received an OR to complete an AD for the patient. Patient has gone to surgery. Nurse said she will return in 3-4 hours. Will attempt later.    01/06/18 1000  Clinical Encounter Type  Visited With Patient not available  Visit Type Initial  Referral From Physician

## 2018-01-06 NOTE — Anesthesia Post-op Follow-up Note (Signed)
Anesthesia QCDR form completed.        

## 2018-01-07 ENCOUNTER — Encounter: Payer: Self-pay | Admitting: Specialist

## 2018-01-07 NOTE — Anesthesia Postprocedure Evaluation (Signed)
Anesthesia Post Note  Patient: Melissa Floyd  Procedure(s) Performed: OPEN REDUCTION INTERNAL FIXATION (ORIF) ANKLE FRACTURE (Right )  Patient location during evaluation: [facility] Anesthesia Type: Spinal Level of consciousness: awake, awake and alert and oriented Pain management: pain level controlled Vital Signs Assessment: post-procedure vital signs reviewed and stable Respiratory status: spontaneous breathing, nonlabored ventilation and respiratory function stable Cardiovascular status: blood pressure returned to baseline and stable Postop Assessment: no headache and no backache Anesthetic complications: no     Last Vitals:  Vitals:   01/06/18 1929 01/07/18 0018  BP: 116/65 (!) 102/55  Pulse: 89 92  Resp:    Temp: 37 C   SpO2: 100% 97%    Last Pain:  Vitals:   01/07/18 0459  TempSrc:   PainSc: Asleep                 Johnna Acosta

## 2018-01-07 NOTE — Progress Notes (Signed)
Subjective: 1 Day Post-Op Procedure(s) (LRB): OPEN REDUCTION INTERNAL FIXATION (ORIF) ANKLE FRACTURE (Right)    Patient reports pain as mild.  Objective:   VITALS:   Vitals:   01/06/18 1929 01/07/18 0018  BP: 116/65 (!) 102/55  Pulse: 89 92  Resp:    Temp: 98.6 F (37 C)   SpO2: 100% 97%    Neurologically intact ABD soft Neurovascular intact Sensation intact distally Intact pulses distally Dorsiflexion/Plantar flexion intact Incision: dressing C/D/I  LABS Recent Labs    01/06/18 0530  HGB 13.1  HCT 38.7  WBC 7.8  PLT 224    Recent Labs    01/06/18 0530  NA 141  K 4.2  BUN 9  CREATININE 0.54  GLUCOSE 92    Recent Labs    01/06/18 0851  INR 0.92     Assessment/Plan: 1 Day Post-Op Procedure(s) (LRB): OPEN REDUCTION INTERNAL FIXATION (ORIF) ANKLE FRACTURE (Right)   Advance diet Up with therapy D/C IV fluids Discharge home with home health tomorrow Patient is not ready for discharge today as of my evaluation.

## 2018-01-07 NOTE — Evaluation (Signed)
Physical Therapy Evaluation Patient Details Name: Melissa Floyd MRN: [id number] DOB: 1956/01/25 Today's Date: 01/07/2018   History of Present Illness  Melissa Floyd is a 62yo female who comes to [facility] after tripping and twisiting her ankle in the carport at home, found to have sustained a trimalleolar fracture with dislocation. Pt presents for PT evaluation on 01/07/18 POD1.   Clinical Impression  Pt admitted with above diagnosis. Pt currently with functional limitations due to the deficits listed below (see "PT Problem List"). Upon entry, pt in bed, no family/caregiver present. The pt is awake and agreeable to participate. The pt is alert and oriented x3, pleasant, conversational, and following complex commands consistently. VSS. Pt mobilizes without physical assistance but is given education on safe and effective RW use. Functional mobility assessment demonstrates increased effort/time requirements, poor tolerance, and need for physical assistance, whereas the patient performed these at a higher level of independence PTA. Pt is educated on precautions of NWB and recommendation to elevate limb. Will plan for stairs training when fiancee comes this afternoon for safe entry/exit of home. Pt will benefit from skilled PT intervention to increase independence and safety with basic mobility in preparation for discharge to the venue listed below.       Follow Up Recommendations Home health PT;Supervision - Intermittent    Equipment Recommendations  Rolling walker with 5" wheels    Recommendations for Other Services       Precautions / Restrictions Precautions Precautions: Fall Restrictions RLE Weight Bearing: Non weight bearing      Mobility  Bed Mobility Overal bed mobility: Modified Independent             General bed mobility comments: additional time and effort but moving well overall.   Transfers Overall transfer level: Needs assistance Equipment used: Rolling walker (2  wheeled) Transfers: Sit to/from Stand Sit to Stand: Supervision         General transfer comment: maintains Rt NWB as directed; tolerates standing x60seconds with LUE for BP assessment.   Ambulation/Gait Ambulation/Gait assistance: Supervision;Min guard Gait Distance (Feet): 40 Feet Assistive device: Rolling walker (2 wheeled)       General Gait Details: hop-to gait with RLE NWB, tolerated well with minimal signs of exersion.   Stairs Stairs: (Plan to do them in afternoon when fiancee is here at 3:00.  )          Wheelchair Mobility    Modified Rankin (Stroke Patients Only)       Balance Overall balance assessment: No apparent balance deficits (not formally assessed)                                           Pertinent Vitals/Pain Pain Assessment: 0-10 Pain Score: 4  Pain Location: Right Ankle  Pain Descriptors / Indicators: Aching Pain Intervention(s): Limited activity within patient's tolerance    Home Living Family/patient expects to be discharged to:: Private residence Living Arrangements: Spouse/significant other Available Help at Discharge: Friend(s) Type of Home: House Home Access: Stairs to enter Entrance Stairs-Rails: Right Entrance Stairs-Number of Steps: 2 steps at back door no rail (3 at front with rails) Home Layout: One level Home Equipment: None      Prior Function Level of Independence: Independent         Comments: working PTA as an Hotel manager [employer]   Dominant Hand: Right  Extremity/Trunk Assessment                Communication   Communication: No difficulties  Cognition Arousal/Alertness: Awake/alert Behavior During Therapy: WFL for tasks assessed/performed Overall Cognitive Status: Within Functional Limits for tasks assessed                                        General Comments      Exercises     Assessment/Plan    PT Assessment Patient needs continued  PT services  PT Problem List Decreased strength;Decreased range of motion;Decreased activity tolerance;Decreased mobility;Decreased knowledge of use of DME       PT Treatment Interventions Functional mobility training;DME instruction;Gait training;Stair training;Therapeutic exercise;Therapeutic activities;Balance training;Patient/family education    PT Goals (Current goals can be found in the Care Plan section)  Acute Rehab PT Goals Patient Stated Goal: return to home and be independent with basic mobility  PT Goal Formulation: With patient Time For Goal Achievement: 01/21/18 Potential to Achieve Goals: Good    Frequency BID   Barriers to discharge Inaccessible home environment      Co-evaluation               AM-PAC PT "6 Clicks" Daily Activity  Outcome Measure Difficulty turning over in bed (including adjusting bedclothes, sheets and blankets)?: A Little Difficulty moving from lying on back to sitting on the side of the bed? : A Little Difficulty sitting down on and standing up from a chair with arms (e.g., wheelchair, bedside commode, etc,.)?: A Little Help needed moving to and from a bed to chair (including a wheelchair)?: A Little Help needed walking in hospital room?: A Little Help needed climbing 3-5 steps with a railing? : A Lot 6 Click Score: 17    End of Session Equipment Utilized During Treatment: Gait belt Activity Tolerance: Patient tolerated treatment well;Patient limited by fatigue Patient left: in chair;with SCD's reapplied;with call bell/phone within reach;with chair alarm set(RLE elevated) Nurse Communication: Mobility status PT Visit Diagnosis: Difficulty in walking, not elsewhere classified (R26.2)    Time: 3354-5625 PT Time Calculation (min) (ACUTE ONLY): 27 min   Charges:   PT Evaluation $PT Eval Low Complexity: 1 Low PT Treatments $Therapeutic Activity: 8-22 mins        10:23 AM, 01/07/18 Etta Grandchild, PT, DPT Physical Therapist -  [facility]  [phone] ([place])    [place]vy C 01/07/2018, 10:19 AM

## 2018-01-07 NOTE — Progress Notes (Signed)
Physical Therapy Treatment Patient Details Name: Melissa Floyd MRN: [id number] DOB: August 27, 1955 Today's Date: 01/07/2018    History of Present Illness Melissa Floyd is a 62yo female who comes to [facility] after tripping and twisiting her ankle in the carport at home, found to have sustained a trimalleolar fracture with dislocation. Pt presents for PT evaluation on 01/07/18 POD1.     PT Comments    Second session this date, with emphasis on stairs training and education on HEP. Pt does well with education on stairs training, seems to maintain education interventions the first time given, fiancee also in attendance to assist at DC. Pt verbalizes some anxiety, but overall BUE strength is sufficient for 4 steps prior to signs of limiting fatigue.Pt transferring at supervision level this date, anticipate mod-I transfers at next visit.  Will continue to follow acutely.   Follow Up Recommendations  Home health PT;Supervision - Intermittent     Equipment Recommendations  Rolling walker with 5" wheels    Recommendations for Other Services       Precautions / Restrictions Precautions Precautions: Fall Restrictions RLE Weight Bearing: Non weight bearing    Mobility  Bed Mobility Overal bed mobility: Independent                Transfers Overall transfer level: Needs assistance Equipment used: Rolling walker (2 wheeled) Transfers: Sit to/from Stand Sit to Stand: Supervision         General transfer comment: good for, safe use of RW coming up,   Ambulation/Gait                 Stairs Stairs: Yes Stairs assistance: Min guard Stair Management: No rails;With walker;Backwards Number of Stairs: 6(has 2 to enter home. ) General stair comments: husband in attendance for educational intervention and minA of RW.    Wheelchair Mobility    Modified Rankin (Stroke Patients Only)       Balance Overall balance assessment: No apparent balance deficits (not formally  assessed)                                          Cognition Arousal/Alertness: Awake/alert Behavior During Therapy: WFL for tasks assessed/performed Overall Cognitive Status: Within Functional Limits for tasks assessed                                        Exercises Other Exercises Other Exercises: HEP education, visual cues and paper handout only: Standing Hip Abduction, standing hip extension, standing knee/hip flexion, standing knee flexion, seated LAQ.     General Comments        Pertinent Vitals/Pain Pain Assessment: 0-10 Pain Score: 4  Pain Location: Right Ankle  Pain Descriptors / Indicators: Aching Pain Intervention(s): Limited activity within patient's tolerance    Home Living                      Prior Function            PT Goals (current goals can now be found in the care plan section) Acute Rehab PT Goals Patient Stated Goal: return to home and be independent with basic mobility  PT Goal Formulation: With patient Time For Goal Achievement: 01/21/18 Potential to Achieve Goals: Good Progress towards PT goals: Progressing toward goals  Frequency    BID      PT Plan Current plan remains appropriate    Co-evaluation              AM-PAC PT "6 Clicks" Daily Activity  Outcome Measure  Difficulty turning over in bed (including adjusting bedclothes, sheets and blankets)?: A Little Difficulty moving from lying on back to sitting on the side of the bed? : A Little Difficulty sitting down on and standing up from a chair with arms (e.g., wheelchair, bedside commode, etc,.)?: A Little Help needed moving to and from a bed to chair (including a wheelchair)?: A Little Help needed walking in hospital room?: A Little Help needed climbing 3-5 steps with a railing? : A Lot 6 Click Score: 17    End of Session Equipment Utilized During Treatment: Gait belt Activity Tolerance: Patient tolerated treatment  well;Patient limited by fatigue Patient left: with SCD's reapplied;with call bell/phone within reach;in bed;with family/visitor present(foot elevated) Nurse Communication: Mobility status PT Visit Diagnosis: Difficulty in walking, not elsewhere classified (R26.2)     Time: 0814-4818 PT Time Calculation (min) (ACUTE ONLY): 31 min  Charges:  $Gait Training: 8-22 mins $Therapeutic Activity: 8-22 mins                3:54 PM, 01/07/18 Etta Grandchild, PT, DPT Physical Therapist - [facility]  [phone] ([place])     [place] C 01/07/2018, 3:51 PM

## 2018-01-08 MED ORDER — ASPIRIN EC 325 MG PO TBEC: 325.0000 mg | DELAYED_RELEASE_TABLET | Freq: Every day | ORAL | 0 refills | Status: AC

## 2018-01-08 MED ORDER — MELOXICAM 15 MG PO TABS: 15.0000 mg | ORAL_TABLET | Freq: Every day | ORAL | 3 refills | Status: DC

## 2018-01-08 MED ORDER — HYDROCODONE-ACETAMINOPHEN 5-325 MG PO TABS: 1.0000 | ORAL_TABLET | Freq: Four times a day (QID) | ORAL | 0 refills | Status: DC | PRN

## 2018-01-08 NOTE — Progress Notes (Signed)
Chaplain checked in on patient's progress since meeting on 8/4. Patient is in good spirits and is looking forward to being at home. She will be discharged today and notes significant support from her fiance. Chaplain wished the patient well in her continued recovery; patient expressed gratitude for the team's care and concern of her. Visit concluded.     01/08/18 1000  Clinical Encounter Type  Visited With Patient  Visit Type Follow-up

## 2018-01-08 NOTE — Progress Notes (Signed)
Pt is in no acute distress. VSS. IV removed. RN explained discharge instructions to pt and pt verbalized understanding. [place] office will call pt with appointment date/time per their office. Pt wheeled to visitors entrance and assisted into car by NT.

## 2018-01-08 NOTE — Progress Notes (Signed)
Physical Therapy Treatment Patient Details Name: Melissa Floyd MRN: [id number] DOB: [birth year] Today's Date: 01/08/2018    History of Present Illness Melissa Floyd is a 61yo female who comes to [facility] after tripping and twisiting her ankle in the carport at home, found to have sustained a trimalleolar fracture with dislocation. Pt presents for PT evaluation on 01/07/18 POD1.     PT Comments    Patient alert and agreeable to PT at start of session. Session focused on stair training. Patient able to ascend/descend stairs with supervision and assistance with walker stabilization several times. Different techniques utilized for patient comfort. Verbal cues needed for technique, and encouragement. Handout administered for successful stair training at home ("going up and down stairs with a walker" edited for NWB). Patient ambulated ~58ft with RW with adherence to NWB on RLE, minimal fatigue noted, no LOB. Patient left in bed per request with all needs in reach.     Follow Up Recommendations  Home health PT;Supervision - Intermittent     Equipment Recommendations  Rolling walker with 5" wheels    Recommendations for Other Services       Precautions / Restrictions Precautions Precautions: Fall Restrictions Weight Bearing Restrictions: Yes RLE Weight Bearing: Non weight bearing    Mobility  Bed Mobility Overal bed mobility: Independent                Transfers   Equipment used: Rolling walker (2 wheeled) Transfers: Sit to/from Stand Sit to Stand: Modified independent (Device/Increase time)            Ambulation/Gait Ambulation/Gait assistance: Supervision Gait Distance (Feet): 50 Feet Assistive device: Rolling walker (2 wheeled)       General Gait Details: hop-to gait with RLE NWB, tolerated well with minimal signs of exersion.    Stairs   Stairs assistance: Min guard Stair Management: No rails;With walker;Backwards Number of Stairs: 9 General stair comments:  attempted multiple times, with PT for stabilization of walker, extensive education for technique. handout administered   Wheelchair Mobility    Modified Rankin (Stroke Patients Only)       Balance Overall balance assessment: No apparent balance deficits (not formally assessed)                                          Cognition                                              Exercises      General Comments        Pertinent Vitals/Pain Pain Assessment: No/denies pain    Home Living                      Prior Function            PT Goals (current goals can now be found in the care plan section) Progress towards PT goals: Progressing toward goals    Frequency    BID      PT Plan Current plan remains appropriate    Co-evaluation              AM-PAC PT "6 Clicks" Daily Activity  Outcome Measure  Difficulty turning over in bed (including adjusting bedclothes, sheets and blankets)?: A Little Difficulty  moving from lying on back to sitting on the side of the bed? : A Little Difficulty sitting down on and standing up from a chair with arms (e.g., wheelchair, bedside commode, etc,.)?: A Little Help needed moving to and from a bed to chair (including a wheelchair)?: A Little Help needed walking in hospital room?: A Little Help needed climbing 3-5 steps with a railing? : A Lot 6 Click Score: 17    End of Session Equipment Utilized During Treatment: Gait belt Activity Tolerance: Patient tolerated treatment well Patient left: with SCD's reapplied;with call bell/phone within reach;in bed;with family/visitor present;Other (comment)(RLE elevated) Nurse Communication: Mobility status PT Visit Diagnosis: Difficulty in walking, not elsewhere classified (R26.2)     Time: 8832-5498 PT Time Calculation (min) (ACUTE ONLY): 28 min  Charges:  $Therapeutic Activity: 23-37 mins                     Melissa Floyd PT,  DPT 9:13 AM,01/08/18 [phone]

## 2018-01-08 NOTE — Discharge Summary (Signed)
Physician Discharge Summary  Patient ID: EYVETTE CORDON MRN: [id number] DOB/AGE: Jul 19, 1955 62 y.o.  Admit date: 01/06/2018 Discharge date: 01/08/2018  Admission Diagnoses: Right trimalleolar fractures dislocation of the ankle  Discharge Diagnoses: Same  Active Problems:   Closed right ankle fracture, initial encounter   Discharged Condition: good  Hospital Course: Patient was admitted Saturday 8 08/11/2016 for severe fracture dislocation of the right ankle.  This was reduced in the emergency room by the emergency room physician.  Patient was cleared for surgery and was taken to surgery later that morning.  She underwent open reduction internal fixation of the trimalleolar fracture without complication.  Postoperatively she is done very well with minimal pain.  She is made progress with physical therapy and is ready for discharge home.  She is to remain nonweightbearing on the leg.  She will take enteric-coated aspirin 3 and 25 mg twice daily well in a cast or boot boot.  She will return to my office in 2 days for exam and x-rays and cast application  Consults: None  Significant Diagnostic Studies: radiology: X-Ray: Satisfactory position of fracture and hardware on fluoroscopic images  Treatments: antibiotics: Kefzol and Cleocin  Discharge Exam: Blood pressure 112/66, pulse 81, temperature 98.6 F (37 C), temperature source Oral, resp. rate 18, height 5\' 8"  (1.727 m), weight 72.6 kg (160 lb), SpO2 96 %.   Incision/Wound: Dressings are clean and dry.  Neurovascular status is good in the foot.  Disposition: Discharge disposition: 01-Home or Self Care       Discharge Instructions    Call MD for:  persistant nausea and vomiting   Complete by:  As directed    Call MD for:  redness, tenderness, or signs of infection (pain, swelling, redness, odor or green/yellow discharge around incision site)   Complete by:  As directed    Call MD for:  severe uncontrolled pain   Complete by:  As  directed    Call MD for:  temperature >100.4   Complete by:  As directed    Diet - low sodium heart healthy   Complete by:  As directed    Discharge instructions   Complete by:  As directed    Elevate operative leg on 2-3 pillows Apply ice as needed RTC as appointed   Increase activity slowly   Complete by:  As directed    Nonweightbearing right leg using walker   Leave dressing on - Keep it clean, dry, and intact until clinic visit   Complete by:  As directed      Allergies as of 01/08/2018      Reactions   Penicillins Rash      Medication List    TAKE these medications   aspirin EC 325 MG tablet Take 1 tablet (325 mg total) by mouth daily.   cetirizine 10 MG tablet Commonly known as:  ZYRTEC Take 10 mg by mouth daily.   fluticasone 50 MCG/ACT nasal spray Commonly known as:  FLONASE Place 1 spray into both nostrils 2 (two) times daily.   gabapentin 300 MG capsule Commonly known as:  NEURONTIN Take 300 mg by mouth 2 (two) times daily.   HYDROcodone-acetaminophen 5-325 MG tablet Commonly known as:  NORCO Take 1 tablet by mouth every 6 (six) hours as needed.   meloxicam 15 MG tablet Commonly known as:  MOBIC Take 1 tablet (15 mg total) by mouth daily.   montelukast 10 MG tablet Commonly known as:  SINGULAIR Take 10 mg by mouth at  bedtime.            Durable Medical Equipment  (From admission, onward)        Start     Ordered   01/08/18 1250  DME 3-in-1  Once    Comments:  Foot surgery   01/08/18 1251   01/08/18 1016  For home use only DME Walker rolling  Once    Question:  Patient needs a walker to treat with the following condition  Answer:  Weakness   01/08/18 1016   01/08/18 1016  For home use only DME Bedside commode  Once    Question:  Patient needs a bedside commode to treat with the following condition  Answer:  Weakness   01/08/18 1016       Signed: Kariya Lavergne E 01/08/2018, 12:52 PM

## 2018-01-08 NOTE — Progress Notes (Signed)
Subjective: 2 Days Post-Op Procedure(s) (LRB): OPEN REDUCTION INTERNAL FIXATION (ORIF) ANKLE FRACTURE (Right)   3 Patient reports pain as mild.  Objective:   VITALS:   Vitals:   01/07/18 2354 01/08/18 0813  BP: 105/60 112/66  Pulse: 83 81  Resp: 18 18  Temp: 99.2 F (37.3 C) 98.6 F (37 C)  SpO2: 99% 96%    Neurologically intact ABD soft Neurovascular intact Sensation intact distally Intact pulses distally Dorsiflexion/Plantar flexion intact Incision: no drainage  LABS Recent Labs    01/06/18 0530  HGB 13.1  HCT 38.7  WBC 7.8  PLT 224    Recent Labs    01/06/18 0530  NA 141  K 4.2  BUN 9  CREATININE 0.54  GLUCOSE 92    Recent Labs    01/06/18 0851  INR 0.92     Assessment/Plan: 2 Days Post-Op Procedure(s) (LRB): OPEN REDUCTION INTERNAL FIXATION (ORIF) ANKLE FRACTURE (Right)   Discharge home today. Nonweightbearing right leg. Return to my office in 2 days for exam and cast application Enteric-coated aspirin 1 p.o. twice daily

## 2018-01-14 ENCOUNTER — Telehealth: Payer: Self-pay

## 2018-01-14 NOTE — Telephone Encounter (Signed)
Flagged on EMMI report for having questions about discharge papers.  First attempt to reach made 01/14/18 at 2:50pm, however unable to reach.  Left message encouraging callback.  Will attempt at later time.

## 2018-01-15 NOTE — Telephone Encounter (Signed)
Second attempt made on 01/15/18 at 11:39am. Someone picked up on other end, however did not say anything.  Was unable to reach when I attempted to call back after hanging up.

## 2018-06-26 ENCOUNTER — Ambulatory Visit: Payer: Self-pay | Admitting: Student

## 2018-06-26 DIAGNOSIS — S9304XA Dislocation of right ankle joint, initial encounter: Secondary | ICD-10-CM | POA: Insufficient documentation

## 2018-06-27 ENCOUNTER — Encounter (HOSPITAL_COMMUNITY): Payer: Self-pay | Admitting: *Deleted

## 2018-06-27 ENCOUNTER — Other Ambulatory Visit: Payer: Self-pay

## 2018-06-28 ENCOUNTER — Ambulatory Visit (HOSPITAL_COMMUNITY): Payer: BLUE CROSS/BLUE SHIELD | Admitting: Anesthesiology

## 2018-06-28 ENCOUNTER — Ambulatory Visit (HOSPITAL_COMMUNITY): Payer: BLUE CROSS/BLUE SHIELD

## 2018-06-28 ENCOUNTER — Encounter: Payer: Self-pay | Admitting: Student

## 2018-06-28 ENCOUNTER — Encounter (HOSPITAL_COMMUNITY)
Admission: RE | Disposition: A | Discharge status: Home / Self Care | Payer: Self-pay | Source: Home / Self Care | Attending: Student

## 2018-06-28 ENCOUNTER — Ambulatory Visit (HOSPITAL_COMMUNITY)
Admission: RE | Admit: 2018-06-28 | Discharge: 2018-06-28 | Disposition: A | Discharge status: Home / Self Care | Payer: BLUE CROSS/BLUE SHIELD | Attending: Student | Admitting: Student

## 2018-06-28 ENCOUNTER — Encounter (HOSPITAL_COMMUNITY): Payer: Self-pay | Admitting: *Deleted

## 2018-06-28 DIAGNOSIS — Z9012 Acquired absence of left breast and nipple: Secondary | ICD-10-CM | POA: Diagnosis not present

## 2018-06-28 DIAGNOSIS — Z7982 Long term (current) use of aspirin: Secondary | ICD-10-CM | POA: Insufficient documentation

## 2018-06-28 DIAGNOSIS — M978XXA Periprosthetic fracture around other internal prosthetic joint, initial encounter: Secondary | ICD-10-CM | POA: Insufficient documentation

## 2018-06-28 DIAGNOSIS — K219 Gastro-esophageal reflux disease without esophagitis: Secondary | ICD-10-CM | POA: Insufficient documentation

## 2018-06-28 DIAGNOSIS — Z79899 Other long term (current) drug therapy: Secondary | ICD-10-CM | POA: Diagnosis not present

## 2018-06-28 DIAGNOSIS — S82871A Displaced pilon fracture of right tibia, initial encounter for closed fracture: Secondary | ICD-10-CM | POA: Insufficient documentation

## 2018-06-28 DIAGNOSIS — Z853 Personal history of malignant neoplasm of breast: Secondary | ICD-10-CM | POA: Diagnosis not present

## 2018-06-28 DIAGNOSIS — Z7951 Long term (current) use of inhaled steroids: Secondary | ICD-10-CM | POA: Insufficient documentation

## 2018-06-28 DIAGNOSIS — Z419 Encounter for procedure for purposes other than remedying health state, unspecified: Secondary | ICD-10-CM

## 2018-06-28 DIAGNOSIS — W010XXA Fall on same level from slipping, tripping and stumbling without subsequent striking against object, initial encounter: Secondary | ICD-10-CM | POA: Diagnosis not present

## 2018-06-28 DIAGNOSIS — F17200 Nicotine dependence, unspecified, uncomplicated: Secondary | ICD-10-CM | POA: Insufficient documentation

## 2018-06-28 DIAGNOSIS — S9304XA Dislocation of right ankle joint, initial encounter: Secondary | ICD-10-CM | POA: Insufficient documentation

## 2018-06-28 DIAGNOSIS — T148XXA Other injury of unspecified body region, initial encounter: Secondary | ICD-10-CM

## 2018-06-28 HISTORY — DX: Personal history of other diseases of the digestive system: Z87.19

## 2018-06-28 HISTORY — DX: Unspecified osteoarthritis, unspecified site: M19.90

## 2018-06-28 HISTORY — DX: Gastro-esophageal reflux disease without esophagitis: K21.9

## 2018-06-28 HISTORY — PX: EXTERNAL FIXATION LEG: SHX1549

## 2018-06-28 HISTORY — DX: Irritable bowel syndrome without diarrhea: K58.9

## 2018-06-28 LAB — COMPREHENSIVE METABOLIC PANEL
ALBUMIN: 3.7 g/dL (ref 3.5–5.0)
ALT: 12 U/L (ref 0–44)
AST: 15 U/L (ref 15–41)
Alkaline Phosphatase: 81 U/L (ref 38–126)
Anion gap: 11 (ref 5–15)
BILIRUBIN TOTAL: 1 mg/dL (ref 0.3–1.2)
BUN: 8 mg/dL (ref 8–23)
CO2: 21 mmol/L — ABNORMAL LOW (ref 22–32)
Calcium: 9.4 mg/dL (ref 8.9–10.3)
Chloride: 105 mmol/L (ref 98–111)
Creatinine, Ser: 0.54 mg/dL (ref 0.44–1.00)
GFR calc Af Amer: >60 mL/min (ref >60)
GFR calc non Af Amer: >60 mL/min (ref >60)
Glucose, Bld: 97 mg/dL (ref 70–99)
Potassium: 4.2 mmol/L (ref 3.5–5.1)
Sodium: 137 mmol/L (ref 135–145)
Total Protein: 7.4 g/dL (ref 6.5–8.1)

## 2018-06-28 LAB — CBC WITH DIFFERENTIAL/PLATELET
Abs Immature Granulocytes: 0.02 10*3/uL (ref 0.00–0.07)
Basophils Absolute: 0.1 10*3/uL (ref 0.0–0.1)
Basophils Relative: 1 %
Eosinophils Absolute: 0.5 10*3/uL (ref 0.0–0.5)
Eosinophils Relative: 6 %
HCT: 41.5 % (ref 36.0–46.0)
Hemoglobin: 13 g/dL (ref 12.0–15.0)
Immature Granulocytes: 0 %
LYMPHS ABS: 1.5 10*3/uL (ref 0.7–4.0)
Lymphocytes Relative: 19 %
MCH: 27.8 pg (ref 26.0–34.0)
MCHC: 31.3 g/dL (ref 30.0–36.0)
MCV: 88.7 fL (ref 80.0–100.0)
Monocytes Absolute: 0.6 10*3/uL (ref 0.1–1.0)
Monocytes Relative: 8 %
NRBC: 0 % (ref 0.0–0.2)
Neutro Abs: 5.2 10*3/uL (ref 1.7–7.7)
Neutrophils Relative %: 66 %
Platelets: 271 10*3/uL (ref 150–400)
RBC: 4.68 MIL/uL (ref 3.87–5.11)
RDW: 14 % (ref 11.5–15.5)
WBC: 7.9 10*3/uL (ref 4.0–10.5)

## 2018-06-28 LAB — SURGICAL PCR SCREEN
MRSA, PCR: NEGATIVE
Staphylococcus aureus: POSITIVE — AB

## 2018-06-28 LAB — SEDIMENTATION RATE: Sed Rate: 25 mm/hr — ABNORMAL HIGH (ref 0–22)

## 2018-06-28 LAB — PROTIME-INR
INR: 0.95
Prothrombin Time: 12.6 seconds (ref 11.4–15.2)

## 2018-06-28 LAB — C-REACTIVE PROTEIN: CRP: 0.8 mg/dL (ref <1.0)

## 2018-06-28 SURGERY — EXTERNAL FIXATION, LOWER EXTREMITY
Anesthesia: General | Site: Ankle | Laterality: Right

## 2018-06-28 MED ORDER — ONDANSETRON HCL 4 MG/2ML IJ SOLN
INTRAMUSCULAR | Status: DC | PRN
  Administered 2018-06-28: 4 mg via INTRAVENOUS

## 2018-06-28 MED ORDER — PROPOFOL 10 MG/ML IV BOLUS
INTRAVENOUS | Status: DC | PRN
  Administered 2018-06-28: 170 mg via INTRAVENOUS

## 2018-06-28 MED ORDER — PHENYLEPHRINE HCL 10 MG/ML IJ SOLN
INTRAMUSCULAR | Status: DC | PRN
  Administered 2018-06-28: 80 ug via INTRAVENOUS

## 2018-06-28 MED ORDER — DEXAMETHASONE SODIUM PHOSPHATE 10 MG/ML IJ SOLN
INTRAMUSCULAR | Status: DC | PRN
  Administered 2018-06-28: 10 mg via INTRAVENOUS

## 2018-06-28 MED ORDER — MIDAZOLAM HCL 5 MG/5ML IJ SOLN
INTRAMUSCULAR | Status: DC | PRN
  Administered 2018-06-28: 2 mg via INTRAVENOUS

## 2018-06-28 MED ORDER — FENTANYL CITRATE (PF) 100 MCG/2ML IJ SOLN
INTRAMUSCULAR | Status: AC
  Filled 2018-06-28: qty 2

## 2018-06-28 MED ORDER — FENTANYL CITRATE (PF) 250 MCG/5ML IJ SOLN
INTRAMUSCULAR | Status: AC
  Filled 2018-06-28: qty 5

## 2018-06-28 MED ORDER — 0.9 % SODIUM CHLORIDE (POUR BTL) OPTIME
TOPICAL | Status: DC | PRN
  Administered 2018-06-28: 1000 mL

## 2018-06-28 MED ORDER — BACITRACIN ZINC 500 UNIT/GM EX OINT
TOPICAL_OINTMENT | CUTANEOUS | Status: AC
  Filled 2018-06-28: qty 28.35

## 2018-06-28 MED ORDER — LIDOCAINE HCL (CARDIAC) PF 100 MG/5ML IV SOSY
PREFILLED_SYRINGE | INTRAVENOUS | Status: DC | PRN
  Administered 2018-06-28: 100 mg via INTRAVENOUS

## 2018-06-28 MED ORDER — PROPOFOL 10 MG/ML IV BOLUS
INTRAVENOUS | Status: AC
  Filled 2018-06-28: qty 20

## 2018-06-28 MED ORDER — LACTATED RINGERS IV SOLN
INTRAVENOUS | Status: DC | PRN
  Administered 2018-06-28: 07:00:00 via INTRAVENOUS

## 2018-06-28 MED ORDER — MIDAZOLAM HCL 2 MG/2ML IJ SOLN
INTRAMUSCULAR | Status: AC
  Filled 2018-06-28: qty 2

## 2018-06-28 MED ORDER — SUCCINYLCHOLINE CHLORIDE 200 MG/10ML IV SOSY
PREFILLED_SYRINGE | INTRAVENOUS | Status: AC
  Filled 2018-06-28: qty 10

## 2018-06-28 MED ORDER — HYDROCODONE-ACETAMINOPHEN 5-325 MG PO TABS: 1.0000 | ORAL_TABLET | Freq: Four times a day (QID) | ORAL | 0 refills | Status: AC | PRN

## 2018-06-28 MED ORDER — VANCOMYCIN HCL 500 MG IV SOLR
INTRAVENOUS | Status: AC
  Filled 2018-06-28: qty 500

## 2018-06-28 MED ORDER — FENTANYL CITRATE (PF) 100 MCG/2ML IJ SOLN
INTRAMUSCULAR | Status: DC | PRN
  Administered 2018-06-28 (×3): 25 ug via INTRAVENOUS
  Administered 2018-06-28: 50 ug via INTRAVENOUS
  Administered 2018-06-28: 25 ug via INTRAVENOUS

## 2018-06-28 MED ORDER — VANCOMYCIN HCL 1000 MG IV SOLR
INTRAVENOUS | Status: AC
  Filled 2018-06-28: qty 1000

## 2018-06-28 MED ORDER — EPHEDRINE 5 MG/ML INJ
INTRAVENOUS | Status: AC
  Filled 2018-06-28: qty 10

## 2018-06-28 MED ORDER — LACTATED RINGERS IV SOLN: INTRAVENOUS | Status: DC

## 2018-06-28 MED ORDER — HYDROCODONE-ACETAMINOPHEN 5-325 MG PO TABS: 1.0000 | ORAL_TABLET | Freq: Four times a day (QID) | ORAL | Status: DC | PRN

## 2018-06-28 MED ORDER — ONDANSETRON HCL 4 MG/2ML IJ SOLN
INTRAMUSCULAR | Status: AC
  Filled 2018-06-28: qty 2

## 2018-06-28 MED ORDER — METOCLOPRAMIDE HCL 5 MG/ML IJ SOLN: 10.0000 mg | Freq: Once | INTRAMUSCULAR | Status: DC | PRN

## 2018-06-28 MED ORDER — FENTANYL CITRATE (PF) 100 MCG/2ML IJ SOLN
25.0000 ug | INTRAMUSCULAR | Status: DC | PRN
  Administered 2018-06-28: 50 ug via INTRAVENOUS

## 2018-06-28 MED ORDER — CEFAZOLIN SODIUM-DEXTROSE 2-4 GM/100ML-% IV SOLN
2.0000 g | INTRAVENOUS | Status: AC
  Administered 2018-06-28: 2 g via INTRAVENOUS
  Filled 2018-06-28: qty 100

## 2018-06-28 MED ORDER — MEPERIDINE HCL 50 MG/ML IJ SOLN: 6.2500 mg | INTRAMUSCULAR | Status: DC | PRN

## 2018-06-28 SURGICAL SUPPLY — 48 items
BANDAGE ACE 4X5 VEL STRL LF (GAUZE/BANDAGES/DRESSINGS) ×2 IMPLANT
BANDAGE ACE 6X5 VEL STRL LF (GAUZE/BANDAGES/DRESSINGS) ×2 IMPLANT
BNDG COHESIVE 4X5 TAN STRL (GAUZE/BANDAGES/DRESSINGS) ×2 IMPLANT
BNDG GAUZE ELAST 4 BULKY (GAUZE/BANDAGES/DRESSINGS) ×2 IMPLANT
BRUSH SCRUB SURG 4.25 DISP (MISCELLANEOUS) ×4 IMPLANT
CHLORAPREP W/TINT 26ML (MISCELLANEOUS) ×2 IMPLANT
CLAMP COMBI 8.0/11.0 LRG/MED (Clamp) ×4 IMPLANT
CLAMP COMBO MED CLIP-ON SELF (EXFIX) ×8 IMPLANT
COVER SURGICAL LIGHT HANDLE (MISCELLANEOUS) ×4 IMPLANT
COVER WAND RF STERILE (DRAPES) ×2 IMPLANT
DRAPE C-ARM 42X72 X-RAY (DRAPES) IMPLANT
DRAPE C-ARMOR (DRAPES) ×2 IMPLANT
DRAPE IMP U-DRAPE 54X76 (DRAPES) ×4 IMPLANT
DRAPE ORTHO SPLIT 77X108 STRL (DRAPES) ×2
DRAPE SURG ORHT 6 SPLT 77X108 (DRAPES) ×2 IMPLANT
DRAPE U-SHAPE 47X51 STRL (DRAPES) ×2 IMPLANT
DRSG MEPITEL 4X7.2 (GAUZE/BANDAGES/DRESSINGS) IMPLANT
ELECT REM PT RETURN 9FT ADLT (ELECTROSURGICAL) ×2
ELECTRODE REM PT RTRN 9FT ADLT (ELECTROSURGICAL) ×1 IMPLANT
GAUZE SPONGE 4X4 12PLY STRL (GAUZE/BANDAGES/DRESSINGS) ×2 IMPLANT
GLOVE BIO SURGEON STRL SZ 6.5 (GLOVE) ×6 IMPLANT
GLOVE BIO SURGEON STRL SZ7.5 (GLOVE) ×8 IMPLANT
GLOVE BIOGEL PI IND STRL 6.5 (GLOVE) ×1 IMPLANT
GLOVE BIOGEL PI IND STRL 7.5 (GLOVE) ×1 IMPLANT
GLOVE BIOGEL PI INDICATOR 6.5 (GLOVE) ×1
GLOVE BIOGEL PI INDICATOR 7.5 (GLOVE) ×1
GOWN STRL REUS W/ TWL LRG LVL3 (GOWN DISPOSABLE) ×2 IMPLANT
GOWN STRL REUS W/TWL LRG LVL3 (GOWN DISPOSABLE) ×2
KIT BASIN OR (CUSTOM PROCEDURE TRAY) ×2 IMPLANT
KIT TURNOVER KIT B (KITS) ×2 IMPLANT
MANIFOLD NEPTUNE II (INSTRUMENTS) ×2 IMPLANT
NEEDLE 22X1 1/2 (OR ONLY) (NEEDLE) IMPLANT
NS IRRIG 1000ML POUR BTL (IV SOLUTION) ×2 IMPLANT
PACK ORTHO EXTREMITY (CUSTOM PROCEDURE TRAY) ×2 IMPLANT
PAD ARMBOARD 7.5X6 YLW CONV (MISCELLANEOUS) ×4 IMPLANT
PAD CAST 4YDX4 CTTN HI CHSV (CAST SUPPLIES) ×1 IMPLANT
PADDING CAST COTTON 4X4 STRL (CAST SUPPLIES) ×1
PADDING CAST COTTON 6X4 STRL (CAST SUPPLIES) ×2 IMPLANT
ROD CARBON FIBER 8.0X160MM (EXFIX) ×2 IMPLANT
ROD CRBN FBR 8.0X120 (Rod) ×2 IMPLANT
SCREW SHANZ 4.0X100 (EXFIX) ×4 IMPLANT
SPONGE LAP 18X18 X RAY DECT (DISPOSABLE) ×2 IMPLANT
STAPLER VISISTAT 35W (STAPLE) ×2 IMPLANT
STRIP CLOSURE SKIN 1/2X4 (GAUZE/BANDAGES/DRESSINGS) IMPLANT
SUT PROLENE 0 CT (SUTURE) IMPLANT
TOWEL OR 17X24 6PK STRL BLUE (TOWEL DISPOSABLE) ×4 IMPLANT
TOWEL OR 17X26 10 PK STRL BLUE (TOWEL DISPOSABLE) ×4 IMPLANT
UNDERPAD 30X30 (UNDERPADS AND DIAPERS) ×2 IMPLANT

## 2018-06-28 NOTE — Transfer of Care (Signed)
Immediate Anesthesia Transfer of Care Note  Patient: Melissa Floyd  Procedure(s) Performed: EXTERNAL FIXATION RIGHT ANKLE (Right Ankle)  Patient Location: PACU  Anesthesia Type:General  Level of Consciousness: awake, alert , oriented and patient cooperative  Airway & Oxygen Therapy: Patient Spontanous Breathing and Patient connected to nasal cannula oxygen  Post-op Assessment: Report given to RN, Post -op Vital signs reviewed and stable and Patient moving all extremities X 4  Post vital signs: Reviewed and stable  Last Vitals:  Vitals Value Taken Time  BP 129/85 06/28/2018  8:35 AM  Temp 36.4 C 06/28/2018  8:35 AM  Pulse 81 06/28/2018  8:36 AM  Resp 15 06/28/2018  8:36 AM  SpO2 99 % 06/28/2018  8:36 AM  Vitals shown include unvalidated device data.  Last Pain:  Vitals:   06/28/18 0633  TempSrc:   PainSc: 0-No pain      Patients Stated Pain Goal: 5 (55/20/80 2233)  Complications: No apparent anesthesia complications

## 2018-06-28 NOTE — Anesthesia Postprocedure Evaluation (Signed)
Anesthesia Post Note  Patient: Melissa Floyd  Procedure(s) Performed: EXTERNAL FIXATION RIGHT ANKLE (Right Ankle)     Patient location during evaluation: PACU Anesthesia Type: General Level of consciousness: awake and alert Pain management: pain level controlled Vital Signs Assessment: post-procedure vital signs reviewed and stable Respiratory status: spontaneous breathing, nonlabored ventilation, respiratory function stable and patient connected to nasal cannula oxygen Cardiovascular status: blood pressure returned to baseline and stable Postop Assessment: no apparent nausea or vomiting Anesthetic complications: no    Last Vitals:  Vitals:   06/28/18 0950 06/28/18 1024  BP: 130/78 135/88  Pulse: 67 71  Resp: 12 13  Temp: (!) 36.4 C (!) 36.4 C  SpO2: 95% 100%    Last Pain:  Vitals:   06/28/18 0955  TempSrc:   PainSc: 0-No pain                 Montez Hageman

## 2018-06-28 NOTE — H&P (Signed)
Orthopaedic Trauma Service (OTS) Consult   Patient ID: Melissa Floyd MRN: [id number] DOB/AGE: 64/04/1956 64 y.o.  Reason for Surgery: Right pilon fracture  HPI: Melissa Floyd is an 63 y.o. female who is presenting for external fixation of her right pilon fracture.  Patient underwent open reduction internal fixation of right trimalleolar ankle fracture with Dr. Earnestine Leys in August 2019.  She was doing relatively well until she tripped and fell and sustained a periarticular distal tibia fracture with talar subluxation.  Due to the complexity of this injury Dr. Sabra Heck felt to be best served by an orthopedic traumatologist.  Patient denies any history of infection or being placed on any antibiotics.  Patient is otherwise healthy.  No major medical problems.  Patient does also that she drinks alcohol weekly.  Past Medical History:  Diagnosis Date  . Arthritis    hands  . Breast cancer ([place])    left   . GERD (gastroesophageal reflux disease)   . History of hiatal hernia   . Hot flashes   . IBS (irritable bowel syndrome)     Past Surgical History:  Procedure Laterality Date  . COLONOSCOPY W/ POLYPECTOMY    . FINGER FRACTURE SURGERY Left    ring  . MASTECTOMY Left   . ORIF ANKLE FRACTURE Right 01/06/2018   Procedure: OPEN REDUCTION INTERNAL FIXATION (ORIF) ANKLE FRACTURE;  Surgeon: Earnestine Leys, MD;  Location: [facility] ORS;  Service: Orthopedics;  Laterality: Right;    History reviewed. No pertinent family history.  Social History:  reports that she has been smoking. She has never used smokeless tobacco. She reports current alcohol use of about 14.0 standard drinks of alcohol per week. She reports that she does not use drugs.  Allergies:  Allergies  Allergen Reactions  . Penicillins Rash    Did it involve swelling of the face/tongue/throat, SOB, or low BP? No Did it involve sudden or severe rash/hives, skin peeling, or any reaction on the inside of your mouth or nose?  Unknown Did you need to seek medical attention at a hospital or doctor's office? No When did it last happen?a while ago  If all above answers are "NO", may proceed with cephalosporin use.     Medications:  No current facility-administered medications on file prior to encounter.    Current Outpatient Medications on File Prior to Encounter  Medication Sig Dispense Refill  . aspirin EC 325 MG tablet Take 1 tablet (325 mg total) by mouth daily. 30 tablet 0  . Calcium Carbonate-Vitamin D (CALCIUM-D) 600-400 MG-UNIT TABS Take 2 tablets by mouth daily.    . cetirizine (ZYRTEC) 10 MG tablet Take 10 mg by mouth daily.    . cycloSPORINE (RESTASIS) 0.05 % ophthalmic emulsion Place 1 drop into both eyes 2 (two) times daily.    . fluticasone (FLONASE) 50 MCG/ACT nasal spray Place 1 spray into both nostrils 2 (two) times daily.    Marland Kitchen gabapentin (NEURONTIN) 300 MG capsule Take 300 mg by mouth 3 (three) times daily.     Marland Kitchen HYDROcodone-acetaminophen (NORCO) 5-325 MG tablet Take 1 tablet by mouth every 6 (six) hours as needed. (Patient taking differently: Take 1 tablet by mouth every 6 (six) hours as needed (pain). ) 40 tablet 0  . hyoscyamine (ANASPAZ) 0.125 MG TBDP disintergrating tablet Place 0.125 mg under the tongue every 6 (six) hours as needed for cramping.     . montelukast (SINGULAIR) 10 MG tablet Take 10 mg by mouth at bedtime.    Marland Kitchen  pantoprazole (PROTONIX) 20 MG tablet Take 20 mg by mouth daily.      ROS: Constitutional: No fever or chills Vision: No changes in vision ENT: No difficulty swallowing CV: No chest pain Pulm: No SOB or wheezing GI: No nausea or vomiting GU: No urgency or inability to hold urine Skin: No poor wound healing Neurologic: No numbness or tingling Psychiatric: No depression or anxiety Heme: No bruising Allergic: No reaction to medications or food   Exam: Blood pressure 133/68, pulse 86, temperature 97.9 F (36.6 C), temperature source Oral, resp. rate 18,  height 5\' 8"  (1.727 m), weight 72.6 kg, SpO2 97 %. General: No acute distress Orientation: Awake alert and oriented x3 Mood and Affect: Cooperative and pleasant Gait: Unable to fully assess due to her fracture Coordination and balance: Within normal limits  Right lower extremity shows obvious deformity with notable swelling.  No skin wrinkling noted on exam.  Sensation diminished to the dorsum of her foot.  She endorses normal sensation of plantar aspect of her foot warm well-perfused foot.  Compartments are soft compressible.  Left lower extremity: skin without lesions. No tenderness to palpation. Full painless ROM, full strength in each muscle groups without evidence of instability.   Medical Decision Making: Imaging: X-rays show a impacted anterior tibial plafond fracture with talar subluxation.  There is notable periosteal reaction along the medial and anterior cortex.  Labs:  Results for orders placed or performed during the hospital encounter of 06/28/18 (from the past 48 hour(s))  CBC WITH DIFFERENTIAL     Status: None   Collection Time: 06/28/18  6:27 AM  Result Value Ref Range   WBC 7.9 4.0 - 10.5 K/uL   RBC 4.68 3.87 - 5.11 MIL/uL   Hemoglobin 13.0 12.0 - 15.0 g/dL   HCT 41.5 36.0 - 46.0 %   MCV 88.7 80.0 - 100.0 fL   MCH 27.8 26.0 - 34.0 pg   MCHC 31.3 30.0 - 36.0 g/dL   RDW 14.0 11.5 - 15.5 %   Platelets 271 150 - 400 K/uL   nRBC 0.0 0.0 - 0.2 %   Neutrophils Relative % 66 %   Neutro Abs 5.2 1.7 - 7.7 K/uL   Lymphocytes Relative 19 %   Lymphs Abs 1.5 0.7 - 4.0 K/uL   Monocytes Relative 8 %   Monocytes Absolute 0.6 0.1 - 1.0 K/uL   Eosinophils Relative 6 %   Eosinophils Absolute 0.5 0.0 - 0.5 K/uL   Basophils Relative 1 %   Basophils Absolute 0.1 0.0 - 0.1 K/uL   Immature Granulocytes 0 %   Abs Immature Granulocytes 0.02 0.00 - 0.07 K/uL    Comment: Performed at [facility], [address], [place], [place] [address]  Medical history and chart was  reviewed  Assessment/Plan: 63 year old female with right periprosthetic pilon fracture with talar subluxation.  Recommend proceeding with external fixation and closed reduction of the talus.  She has a complex problem and there is radiographic signs that there may be a some other underlying process as she has a periosteal reaction along her distal tibia.  I discussed risks and benefits with the patient.  She agrees to proceed with external fixation.  We will likely plan to get a CT scan postoperatively for surgical planning also obtain lab work.  Patient will discharge home today postoperatively.   Shona Needles, MD [facility] [phone] (phone)

## 2018-06-28 NOTE — Op Note (Signed)
Orthopaedic Surgery Operative Note (CSN: [id number] ) Date of Surgery: 06/28/2018  Admit Date: 06/28/2018   Diagnoses: Pre-Op Diagnoses: Right periprosthetic pilon fracture  Post-Op Diagnosis: Same  Procedures: CPT 20690-External fixation of right ankle  Surgeons : Primary: Shona Needles, MD  Assistant: Patrecia Pace, PA-C  Location:OR 3   Anesthesia:General   Antibiotics: Ancef 2g preop   Tourniquet time:None  Estimated Blood Loss:Minimal  Complications:None  Specimens:None  Implants: Synthes large and medium external fixator  Indications for Surgery: 63 year old female who had a history of a right trimalleolar ankle fracture in August 2019.  She had an open reduction internal fixation.  She was doing well when she tripped and fell and sustained a periprosthetic right pilon fracture.  Due to the complexity of her injury it was felt that it would be best treated by an orthopedic traumatologist.  After reviewing her images with her anterior impaction with subluxation of her talus I recommended proceeding with closed reduction and external fixation.  We will likely perform a CT scan to evaluate the joint and and obtain lab work to determine whether or not she has any arthropathy.  Risks and benefits were discussed with the patient.  She agreed to proceed with surgery.  Operative Findings: 1.  Periprosthetic right pilon fracture with anterior joint dislocation/subluxation treated with closed reduction and external fixation. 2.  Radiographic findings that are concerning for neuropathy or chronic problem.  Procedure: The patient was identified in the preoperative holding area. Consent was confirmed with the patient and their family and all questions were answered. The operative extremity was marked after confirmation with the patient. They were then brought back to the operating room by our anesthesia colleagues. They were carefully transferred over to a radiolucent flat top  table and placed under general anesthesia.The operative extremity was then prepped and draped in usual sterile fashion. A preoperative timeout was performed to verify the patient, the procedure, and the extremity. Preoperative antibiotics were dosed.  Using a 6-hole pin clamp as a guide I made two percutaneous incisions and predrilled the tibia and placed 5.37mm threaded half pins, gaining excellent purchase. A lateral fluoroscopic view was obtained to confirm adequate length through the far cortex. A small incision was made over the medial calcaneus a 6.46mm calcaneal transfixation pin was placed through the heel and lateral fluoro was used to confirm placement. Fluoroscopy was then used to visualize the base of the 1st and 5th metatarsals and a percutaneous incision was made over each. A 2.2mm drill bit was used to predrill both cortices of each bone and a 4.34mm pin was placed. Pin to bar clamps were placed and a medium ex-fix bar was placed connecting the metatarsal pins to the transfixion pin in the calcaneus on both the medial and lateral side. Large bars were used to connect the tibial pin clamp to the medium bars and a reduction maneuver was performed.   The talus was reduced underneath the plafond and all of the clamps were tightened. A lateral view was obtained to show adequate reduction of the talus and the clamps were final tightened. The pin sites were dressed with kerlix and the drapes were broken down. The patient was then awoken from anesthesia and taken to the PACU in stable condition.  Post Op Plan/Instructions: Patient will be nonweightbearing to the right lower extremity.  Will obtain a CT scan to evaluate her ankle joint.  She will return next week for skin check.  She will be placed on 325 mg  aspirin for DVT prophylaxis.  I was present and performed the entire surgery.  Patrecia Pace, PA-C did assist me throughout the case. An assistant was necessary given the difficulty in approach,  maintenance of reduction and ability to instrument the fracture.   Katha Hamming, MD [facility]

## 2018-06-28 NOTE — Anesthesia Procedure Notes (Signed)
Procedure Name: LMA Insertion Date/Time: 06/28/2018 7:41 AM Performed by: Carney Living, CRNA Pre-anesthesia Checklist: Patient identified, Emergency Drugs available, Suction available, Patient being monitored and Timeout performed Patient Re-evaluated:Patient Re-evaluated prior to induction Oxygen Delivery Method: Circle system utilized Preoxygenation: Pre-oxygenation with 100% oxygen Induction Type: IV induction LMA Size: 4.0 Number of attempts: 1 Placement Confirmation: positive ETCO2 and breath sounds checked- equal and bilateral Tube secured with: Tape Dental Injury: Teeth and Oropharynx as per pre-operative assessment

## 2018-06-28 NOTE — Discharge Instructions (Signed)
Orthopaedic Trauma Service Discharge Instructions   General Discharge Instructions  WEIGHT BEARING STATUS: Non-weightbearing right lower extremity  RANGE OF MOTION/ACTIVITY: Full range of motion of the knee  Wound Care: Keep pin sites clean and dry. Dress daily with Kerlix roll starting on post-operative day #2  DVT/PE prophylaxis: Aspirin 325 mg  Diet: as you were eating previously.  Can use over the counter stool softeners and bowel preparations, such as Miralax, to help with bowel movements.  Narcotics can be constipating.  Be sure to drink plenty of fluids  PAIN MEDICATION USE AND EXPECTATIONS  You have likely been given narcotic medications to help control your pain.  After a traumatic event that results in an fracture (broken bone) with or without surgery, it is ok to use narcotic pain medications to help control one's pain.  We understand that everyone responds to pain differently and each individual patient will be evaluated on a regular basis for the continued need for narcotic medications. Ideally, narcotic medication use should last no more than 6-8 weeks (coinciding with fracture healing).   As a patient it is your responsibility as well to monitor narcotic medication use and report the amount and frequency you use these medications when you come to your office visit.   We would also advise that if you are using narcotic medications, you should take a dose prior to therapy to maximize you participation.  IF YOU ARE ON NARCOTIC MEDICATIONS IT IS NOT PERMISSIBLE TO OPERATE A MOTOR VEHICLE (MOTORCYCLE/CAR/TRUCK/MOPED) OR HEAVY MACHINERY DO NOT MIX NARCOTICS WITH OTHER CNS (CENTRAL NERVOUS SYSTEM) DEPRESSANTS SUCH AS ALCOHOL   STOP SMOKING OR USING NICOTINE PRODUCTS!!!!  As discussed nicotine severely impairs your body's ability to heal surgical and traumatic wounds but also impairs bone healing.  Wounds and bone heal by forming microscopic blood vessels (angiogenesis) and  nicotine is a vasoconstrictor (essentially, shrinks blood vessels).  Therefore, if vasoconstriction occurs to these microscopic blood vessels they essentially disappear and are unable to deliver necessary nutrients to the healing tissue.  This is one modifiable factor that you can do to dramatically increase your chances of healing your injury.    (This means no smoking, no nicotine gum, patches, etc)  DO NOT USE NONSTEROIDAL ANTI-INFLAMMATORY DRUGS (NSAID'S)  Using products such as Advil (ibuprofen), Aleve (naproxen), Motrin (ibuprofen) for additional pain control during fracture healing can delay and/or prevent the healing response.  If you would like to take over the counter (OTC) medication, Tylenol (acetaminophen) is ok.  However, some narcotic medications that are given for pain control contain acetaminophen as well. Therefore, you should not exceed more than 4000 mg of tylenol in a day if you do not have liver disease.  Also note that there are may OTC medicines, such as cold medicines and allergy medicines that my contain tylenol as well.  If you have any questions about medications and/or interactions please ask your doctor/PA or your pharmacist.      ICE AND ELEVATE INJURED/OPERATIVE EXTREMITY  Using ice and elevating the injured extremity above your heart can help with swelling and pain control.  Icing in a pulsatile fashion, such as 20 minutes on and 20 minutes off, can be followed.    Do not place ice directly on skin. Make sure there is a barrier between to skin and the ice pack.    Using frozen items such as frozen peas works well as the conform nicely to the are that needs to be iced.  USE AN  ACE WRAP OR TED HOSE FOR SWELLING CONTROL  In addition to icing and elevation, Ace wraps or TED hose are used to help limit and resolve swelling.  It is recommended to use Ace wraps or TED hose until you are informed to stop.    When using Ace Wraps start the wrapping distally (farthest away from  the body) and wrap proximally (closer to the body)   Example: If you had surgery on your leg or thing and you do not have a splint on, start the ace wrap at the toes and work your way up to the thigh        If you had surgery on your upper extremity and do not have a splint on, start the ace wrap at your fingers and work your way up to the upper arm  [place]: [phone]      Discharge Pin Site Instructions  Dress pins daily with Kerlix roll starting on POD 2. Wrap the Kerlix so that it tamps the skin down around the pin-skin interface to prevent/limit motion of the skin relative to the pin.  (Pin-skin motion is the primary cause of pain and infection related to external fixator pin sites).  Remove any crust or coagulum that may obstruct drainage with a saline moistened gauze or soap and water.  After POD 3, if there is no discernable drainage on the pin site dressing, the interval for change can by increased to every other day.  You may shower with the fixator, cleaning all pin sites gently with soap and water.  If you have a surgical wound this needs to be completely dry and without drainage before showering.  The extremity can be lifted by the fixator to facilitate wound care and transfers.  Notify the office/Doctor if you experience increasing drainage, redness, or pain from a pin site, or if you notice purulent (thick, snot-like) drainage.  Discharge Wound Care Instructions  Do NOT apply any ointments, solutions or lotions to pin sites or surgical wounds.  These prevent needed drainage and even though solutions like hydrogen peroxide kill bacteria, they also damage cells lining the pin sites that help fight infection.  Applying lotions or ointments can keep the wounds moist and can cause them to breakdown and open up as well. This can increase the risk for infection. When in doubt call the office.  Surgical incisions should be dressed  daily.  If any drainage is noted, use one layer of adaptic, then gauze, Kerlix, and an ace wrap.  Once the incision is completely dry and without drainage, it may be left open to air out.  Showering may begin 36-48 hours later.  Cleaning gently with soap and water.  Traumatic wounds should be dressed daily as well.    One layer of adaptic, gauze, Kerlix, then ace wrap.  The adaptic can be discontinued once the draining has ceased    If you have a wet to dry dressing: wet the gauze with saline the squeeze as much saline out so the gauze is moist (not soaking wet), place moistened gauze over wound, then place a dry gauze over the moist one, followed by Kerlix wrap, then ace wrap.

## 2018-06-28 NOTE — Anesthesia Preprocedure Evaluation (Addendum)
Anesthesia Evaluation  Patient identified by MRN, date of birth, ID band Patient awake    Reviewed: Allergy & Precautions, NPO status , Patient's Chart, lab work & pertinent test results  Airway Mallampati: II  TM Distance: >3 FB Neck ROM: Full    Dental no notable dental hx. (+) Upper Dentures, Dental Advisory Given   Pulmonary Current Smoker,    Pulmonary exam normal breath sounds clear to auscultation       Cardiovascular negative cardio ROS Normal cardiovascular exam Rhythm:Regular Rate:Normal     Neuro/Psych negative neurological ROS  negative psych ROS   GI/Hepatic Neg liver ROS, GERD  Medicated and Controlled,  Endo/Other  negative endocrine ROS  Renal/GU negative Renal ROS  negative genitourinary   Musculoskeletal negative musculoskeletal ROS (+)   Abdominal   Peds negative pediatric ROS (+)  Hematology negative hematology ROS (+)   Anesthesia Other Findings   Reproductive/Obstetrics negative OB ROS                            Anesthesia Physical Anesthesia Plan  ASA: II  Anesthesia Plan: General   Post-op Pain Management:    Induction: Intravenous  PONV Risk Score and Plan: 3 and Ondansetron, Dexamethasone and Midazolam  Airway Management Planned: LMA  Additional Equipment:   Intra-op Plan:   Post-operative Plan: Extubation in OR  Informed Consent: I have reviewed the patients History and Physical, chart, labs and discussed the procedure including the risks, benefits and alternatives for the proposed anesthesia with the patient or authorized representative who has indicated his/her understanding and acceptance.     Dental advisory given  Plan Discussed with: CRNA, Anesthesiologist and Surgeon  Anesthesia Plan Comments:        Anesthesia Quick Evaluation

## 2018-06-29 LAB — VITAMIN D 25 HYDROXY (VIT D DEFICIENCY, FRACTURES): Vit D, 25-Hydroxy: 13.9 ng/mL — ABNORMAL LOW (ref 30.0–100.0)

## 2018-07-01 ENCOUNTER — Encounter (HOSPITAL_COMMUNITY): Payer: Self-pay | Admitting: Student

## 2018-07-05 ENCOUNTER — Ambulatory Visit: Payer: Self-pay | Admitting: Student

## 2018-07-05 DIAGNOSIS — S9304XA Dislocation of right ankle joint, initial encounter: Secondary | ICD-10-CM

## 2018-07-08 ENCOUNTER — Encounter (HOSPITAL_COMMUNITY): Payer: Self-pay | Admitting: *Deleted

## 2018-07-08 ENCOUNTER — Other Ambulatory Visit: Payer: Self-pay

## 2018-07-08 NOTE — Progress Notes (Signed)
Spoke with pt for pre-op call. Pt just had surgery here within the last 2 weeks. She states nothing has changed with allergies, meds, medical and surgical history. She states Dr. Doreatha Martin instructed her not to stop her Aspirin.

## 2018-07-10 ENCOUNTER — Inpatient Hospital Stay (HOSPITAL_COMMUNITY): Admission: RE | Admit: 2018-07-10 | Payer: BLUE CROSS/BLUE SHIELD | Source: Home / Self Care | Admitting: Student

## 2018-07-10 SURGERY — OPEN REDUCTION INTERNAL FIXATION (ORIF) TIBIA/FIBULA FRACTURE
Anesthesia: General | Laterality: Right

## 2019-09-24 IMAGING — CT CT ANKLE*R* W/O CM
3 of 5 series · 11 of 33 positions shown, 14 images · non-contrast
Comparison: Right ankle x-rays from same day.

CLINICAL DATA: History of prior trimalleolar fracture status post
ORIF, complicated by periprosthetic Jaiteh fracture, now status post
external fixation this morning.

EXAM:
CT OF THE RIGHT ANKLE WITHOUT CONTRAST
TECHNIQUE: Multidetector CT imaging of the right ankle was performed according
to the standard protocol. Multiplanar CT image reconstructions were
also generated.

[Series 3: lower ext 3.0 u90u · axial · 0.33mm/px · z∈[+156,+324]mm · 5 of 86 slices shown, 7 images]
[im 15/86  soft-tissue]
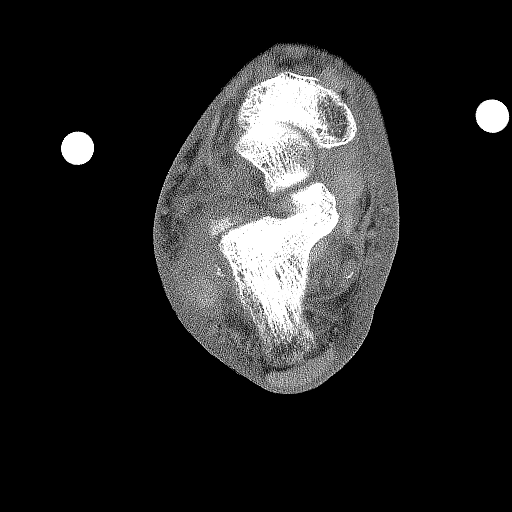
[im 15/86  bone]
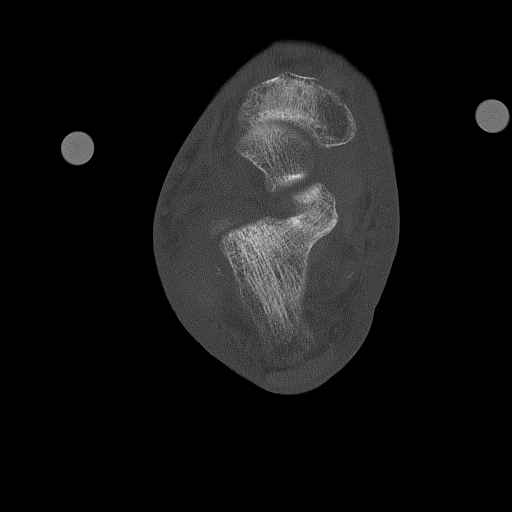
[im 29/86  bone]
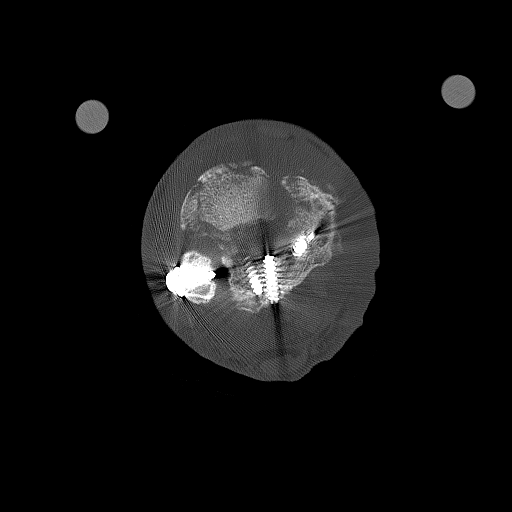
[im 43/86  bone]
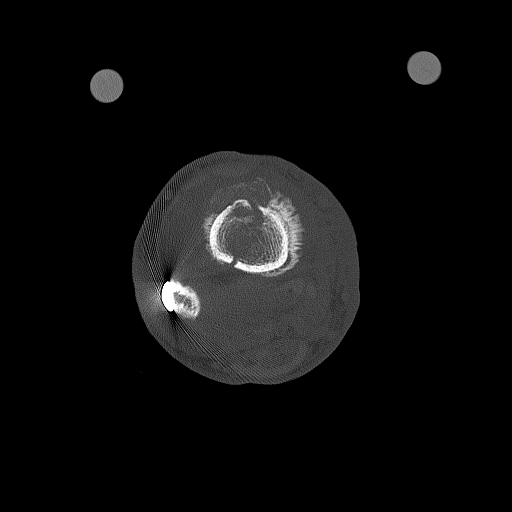
[im 57/86  bone]
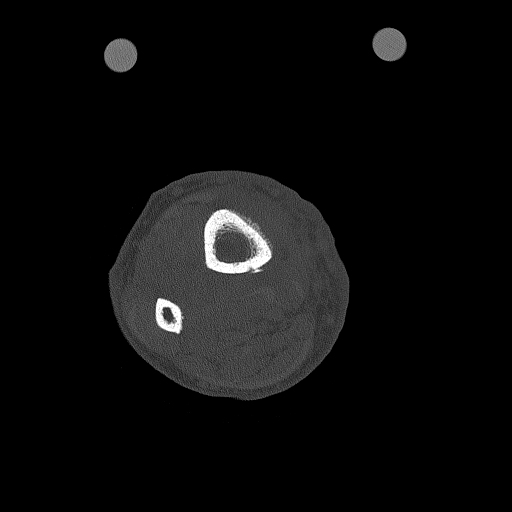
[im 71/86  soft-tissue]
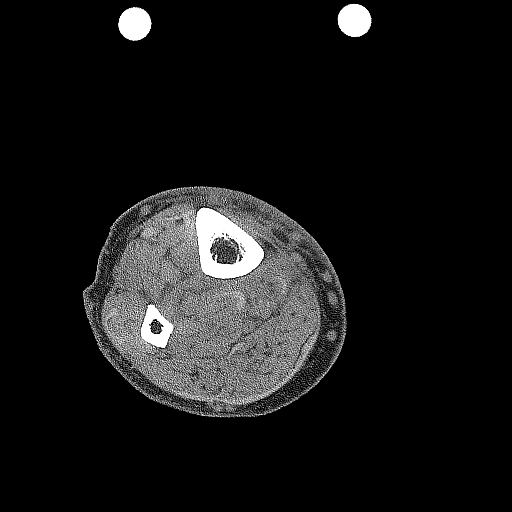
[im 71/86  bone]
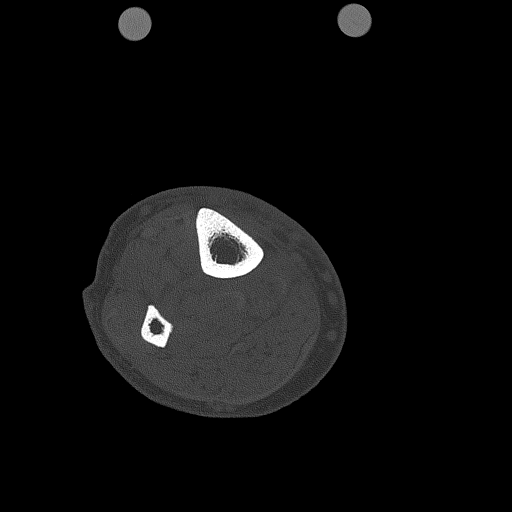

[Series 5: coronal bone · coronal · 0.28mm/px · 1 of 76 slices shown]
[im 38/76  bone]
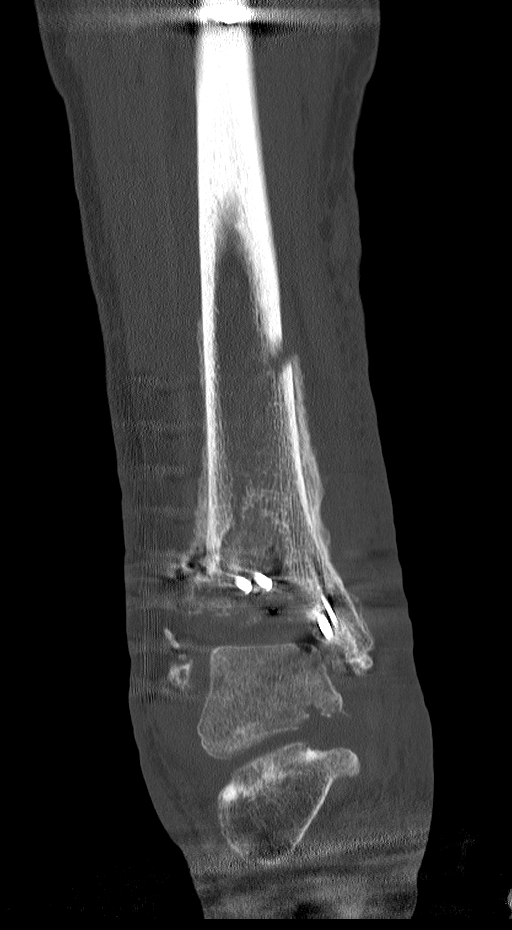

[Series 8: sagittal soft tissue · sagittal · 0.30mm/px · 5 of 56 slices shown, 6 images]
[im 19/56  bone]
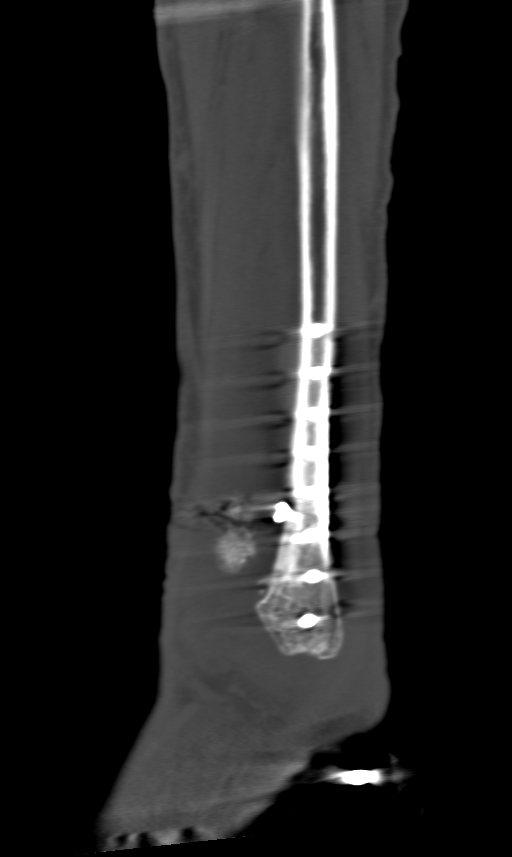
[im 23/56  bone]
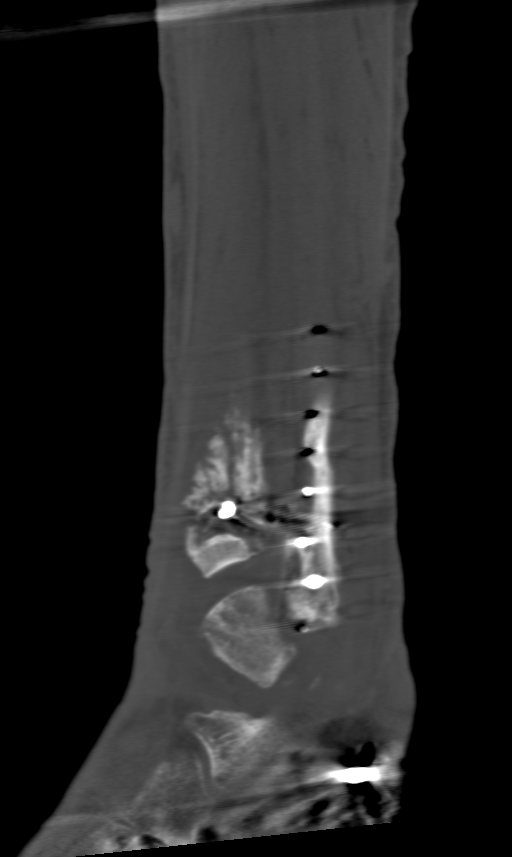
[im 28/56  soft-tissue]
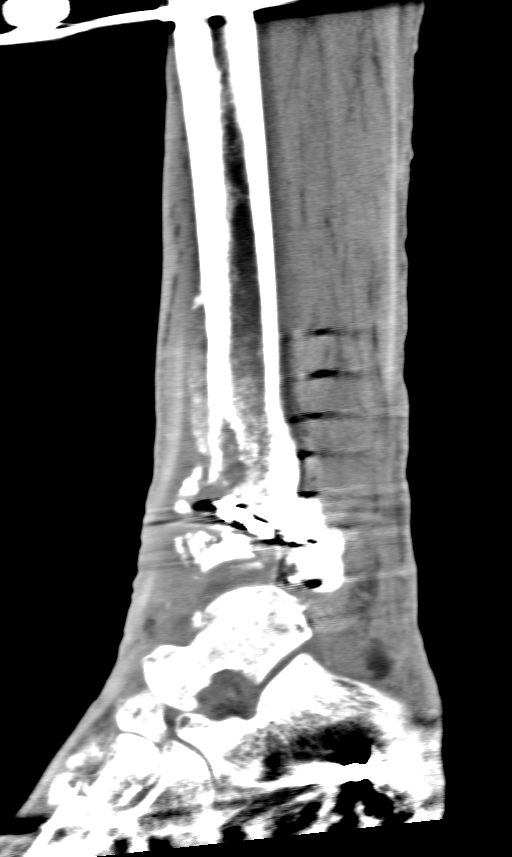
[im 28/56  bone]
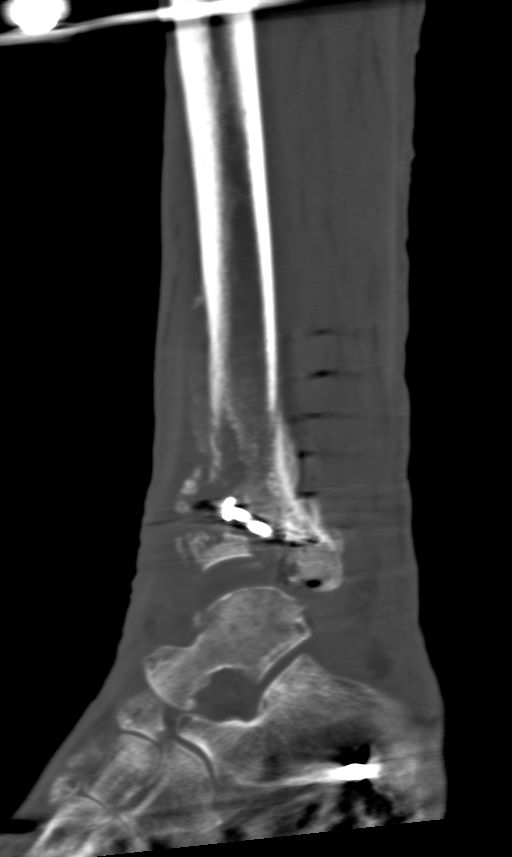
[im 33/56  bone]
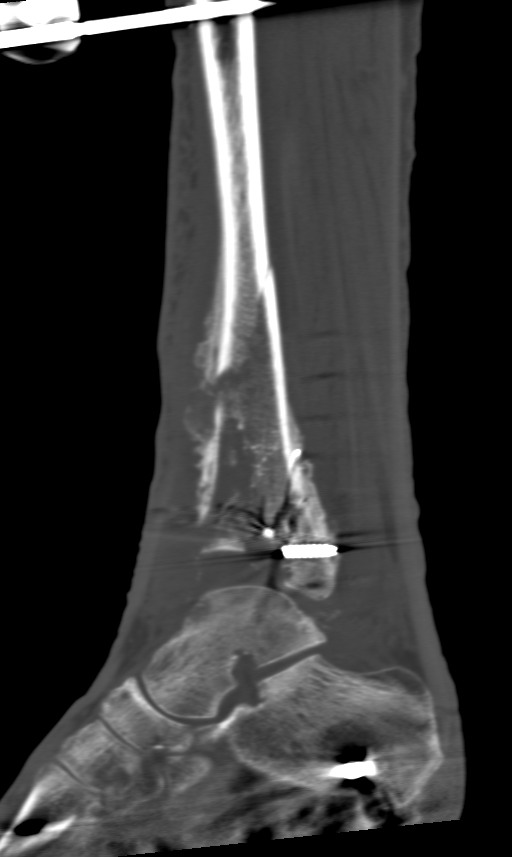
[im 37/56  bone]
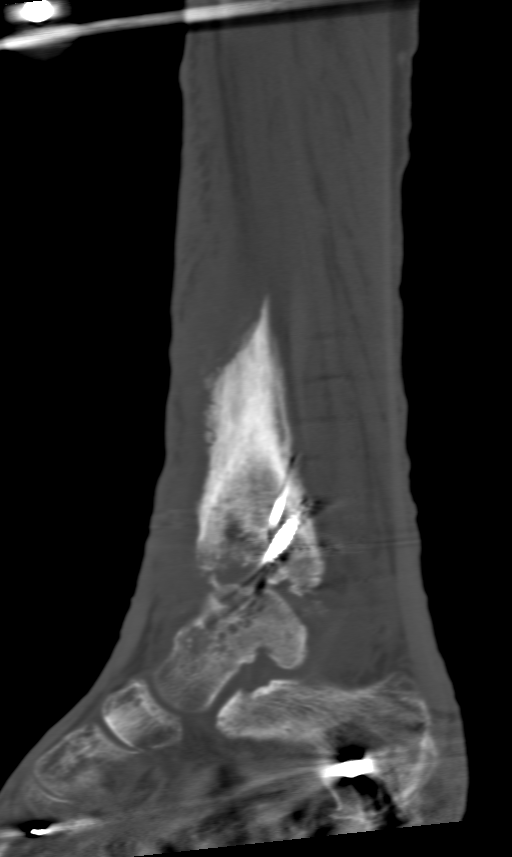

[11 of 33 positions shown; findings below may reference images not displayed]

FINDINGS: Bones/Joint/Cartilage

External fixator hardware noted. Healed medial malleolar and distal
fibular fractures status post ORIF. Mild lucency surrounding the
distal most fibular screw with slight backing out. Prior posterior
malleolar fixation with two screws, both of which are fractured,
likely secondary to the acute, impacted fracture of the tibial
plafond with oblique extension superiorly to the distal diaphysis.
The plafond is impacted up to 9 mm. No dislocation.

There is extensive periosteal reaction of the distal tibia. There is
prominent intramedullary lucency along the anterior distal tibial
metaphysis. There is focal erosive change of the anterior talar
dome. Large tibiotalar joint effusion with punctate fracture
fragments. Disuse osteopenia.

Ligaments

Suboptimally assessed by CT.

Muscles and Tendons

No muscle atrophy. The flexor, peroneal, extensor, and Achilles
tendons are grossly intact.

Soft tissues

Diffuse soft tissue swelling about the ankle.  No fluid collection.
IMPRESSION: 1. Acute, impacted fracture of the tibial plafond with oblique
extension superiorly to the distal diaphysis.
2. Resultant fracture of the two posterior malleolar fixation
screws.
3. Extensive periosteal reaction and prominent intramedullary
lucency of the distal tibia. Focal erosive change of the anterior
talar dome. Given associated large tibiotalar joint effusion,
findings are most concerning for septic arthritis and
osteomyelitis.. Correlation with arthrocentesis is recommended.
4. Healed medial malleolar and distal fibular fractures. Mild
loosening of the distal most fibular screw.
